# Patient Record
Sex: Female | Born: 1975 | Race: White | Hispanic: No | Marital: Married | Smoking: Never smoker
Health system: Southern US, Community
[De-identification: ages and names within clinical notes are randomized; demographics above are authoritative.]

## PROBLEM LIST (undated history)

## (undated) DIAGNOSIS — R000 Tachycardia, unspecified: Secondary | ICD-10-CM

## (undated) DIAGNOSIS — G43009 Migraine without aura, not intractable, without status migrainosus: Secondary | ICD-10-CM

## (undated) DIAGNOSIS — M25519 Pain in unspecified shoulder: Secondary | ICD-10-CM

## (undated) DIAGNOSIS — R599 Enlarged lymph nodes, unspecified: Secondary | ICD-10-CM

## (undated) DIAGNOSIS — M5412 Radiculopathy, cervical region: Secondary | ICD-10-CM

## (undated) DIAGNOSIS — IMO0002 Reserved for concepts with insufficient information to code with codable children: Secondary | ICD-10-CM

## (undated) DIAGNOSIS — M542 Cervicalgia: Secondary | ICD-10-CM

## (undated) DIAGNOSIS — O99810 Abnormal glucose complicating pregnancy: Secondary | ICD-10-CM

## (undated) DIAGNOSIS — R42 Dizziness and giddiness: Secondary | ICD-10-CM

## (undated) DIAGNOSIS — R7309 Other abnormal glucose: Secondary | ICD-10-CM

## (undated) DIAGNOSIS — F329 Major depressive disorder, single episode, unspecified: Secondary | ICD-10-CM

## (undated) DIAGNOSIS — F3289 Other specified depressive episodes: Secondary | ICD-10-CM

## (undated) HISTORY — PX: ACHILLES TENDON REPAIR: SUR1153

## (undated) HISTORY — DX: Other abnormal glucose: R73.09

## (undated) HISTORY — DX: Radiculopathy, cervical region: M54.12

## (undated) HISTORY — PX: OTHER SURGICAL HISTORY: SHX169

## (undated) HISTORY — DX: Migraine without aura, not intractable, without status migrainosus: G43.009

## (undated) HISTORY — DX: Cervicalgia: M54.2

## (undated) HISTORY — DX: Dizziness and giddiness: R42

## (undated) HISTORY — DX: Morbid (severe) obesity due to excess calories: E66.01

## (undated) HISTORY — DX: Major depressive disorder, single episode, unspecified: F32.9

## (undated) HISTORY — DX: Reserved for concepts with insufficient information to code with codable children: IMO0002

## (undated) HISTORY — PX: CARPAL TUNNEL RELEASE: SHX101

## (undated) HISTORY — DX: Abnormal glucose complicating pregnancy: O99.810

## (undated) HISTORY — DX: Enlarged lymph nodes, unspecified: R59.9

## (undated) HISTORY — DX: Pain in unspecified shoulder: M25.519

## (undated) HISTORY — DX: Tachycardia, unspecified: R00.0

## (undated) HISTORY — DX: Other specified depressive episodes: F32.89

---

## 2002-06-08 HISTORY — PX: OTHER SURGICAL HISTORY: SHX169

## 2002-12-19 ENCOUNTER — Encounter: Payer: Self-pay | Admitting: Family Medicine

## 2005-02-07 ENCOUNTER — Inpatient Hospital Stay (HOSPITAL_COMMUNITY): Admission: AD | Admit: 2005-02-07 | Discharge: 2005-02-07 | Payer: Self-pay | Admitting: Obstetrics & Gynecology

## 2005-06-15 ENCOUNTER — Encounter: Admission: RE | Admit: 2005-06-15 | Discharge: 2005-06-15 | Payer: Self-pay | Admitting: Obstetrics and Gynecology

## 2005-08-06 ENCOUNTER — Inpatient Hospital Stay (HOSPITAL_COMMUNITY): Admission: AD | Admit: 2005-08-06 | Discharge: 2005-08-06 | Payer: Self-pay | Admitting: Obstetrics and Gynecology

## 2005-08-17 ENCOUNTER — Inpatient Hospital Stay (HOSPITAL_COMMUNITY): Admission: AD | Admit: 2005-08-17 | Discharge: 2005-08-20 | Payer: Self-pay | Admitting: Obstetrics and Gynecology

## 2005-08-21 ENCOUNTER — Encounter: Admission: RE | Admit: 2005-08-21 | Discharge: 2005-09-18 | Payer: Self-pay | Admitting: Obstetrics and Gynecology

## 2005-08-23 ENCOUNTER — Observation Stay (HOSPITAL_COMMUNITY): Admission: AD | Admit: 2005-08-23 | Discharge: 2005-08-23 | Payer: Self-pay | Admitting: Obstetrics and Gynecology

## 2006-05-11 ENCOUNTER — Encounter: Payer: Self-pay | Admitting: General Practice

## 2006-06-08 ENCOUNTER — Encounter: Payer: Self-pay | Admitting: General Practice

## 2006-06-17 ENCOUNTER — Ambulatory Visit: Payer: Self-pay | Admitting: Family Medicine

## 2006-06-17 LAB — CONVERTED CEMR LAB
Alkaline Phosphatase: 28 units/L — ABNORMAL LOW (ref 39–117)
CO2: 29 meq/L (ref 19–32)
Calcium: 9.2 mg/dL (ref 8.4–10.5)
Chloride: 101 meq/L (ref 96–112)
Cholesterol: 129 mg/dL (ref 0–200)
GFR calc non Af Amer: 90 mL/min
Glucose, Bld: 108 mg/dL — ABNORMAL HIGH (ref 70–99)
HDL: 34 mg/dL — ABNORMAL LOW (ref >39.0)
Hemoglobin: 12 g/dL (ref 12.0–15.0)
Total Protein: 7.1 g/dL (ref 6.0–8.3)
Triglyceride fasting, serum: 121 mg/dL (ref 0–149)

## 2006-06-18 ENCOUNTER — Encounter: Payer: Self-pay | Admitting: Family Medicine

## 2006-08-05 ENCOUNTER — Ambulatory Visit: Payer: Self-pay | Admitting: Family Medicine

## 2006-08-05 LAB — CONVERTED CEMR LAB
Basophils Relative: 0 % (ref 0.0–1.0)
Eosinophils Relative: 1.6 % (ref 0.0–5.0)
Lymphocytes Relative: 26.3 % (ref 12.0–46.0)
MCV: 85.9 fL (ref 78.0–100.0)
Mono Screen: NEGATIVE
Monocytes Absolute: 0.4 10*3/uL (ref 0.2–0.7)
Monocytes Relative: 6.7 % (ref 3.0–11.0)
Neutrophils Relative %: 65.4 % (ref 43.0–77.0)
WBC: 6.6 10*3/uL (ref 4.5–10.5)

## 2006-08-25 ENCOUNTER — Ambulatory Visit: Payer: Self-pay | Admitting: Internal Medicine

## 2006-12-14 IMAGING — US US OB COMP LESS 14 WK
1 series · 14 of 24 positions shown · non-contrast
Comparison: None.

CLINICAL DATA: Pelvic pain and vaginal bleeding at 11 weeks and 4 days of
pregnancy.

COMPLETE OBSTETRICAL ULTRASOUND LESS THAN 14 WEEKS

[Series 1: us ob comp less 14 wk · 0.29mm/px · 14 of 24 slices shown]
[im 1/24]
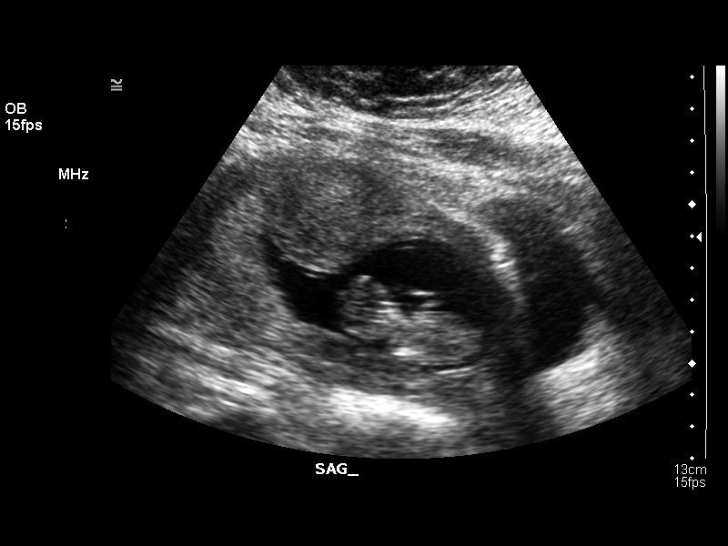
[im 3/24]
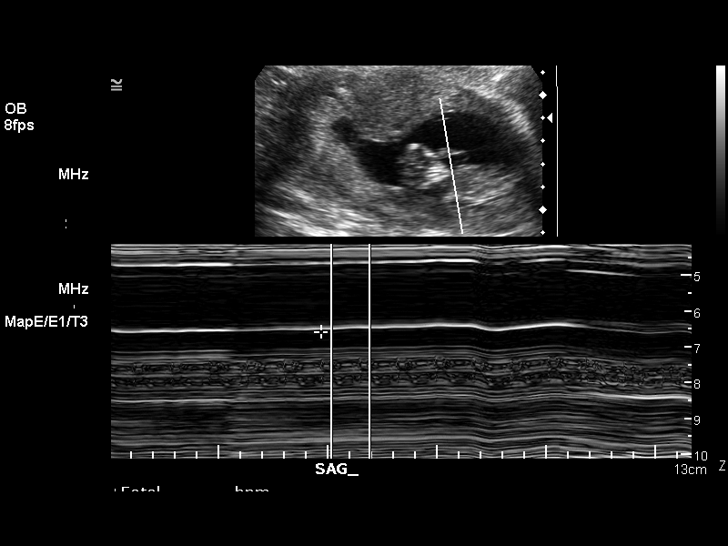
[im 5/24]
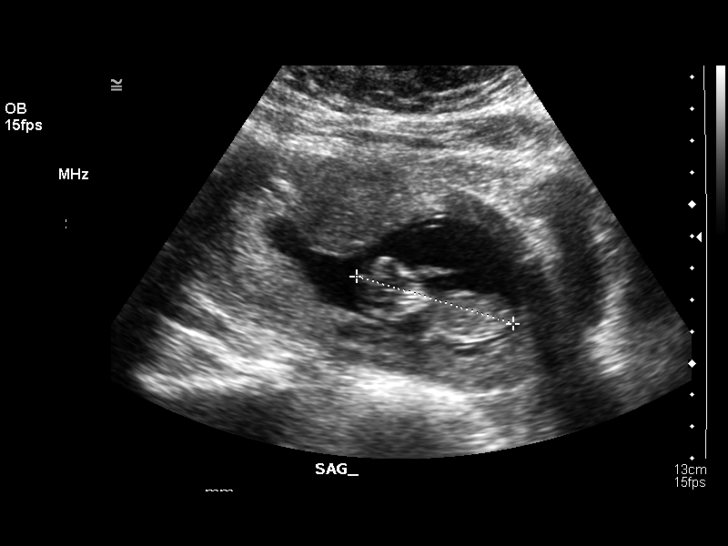
[im 7/24]
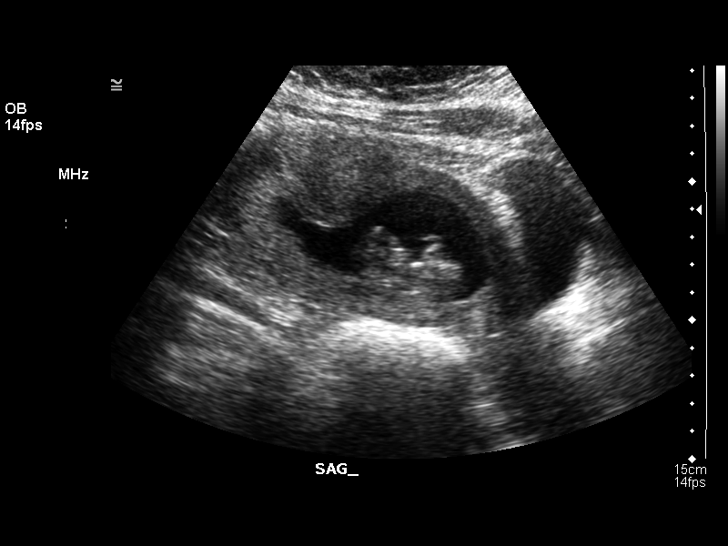
[im 8/24]
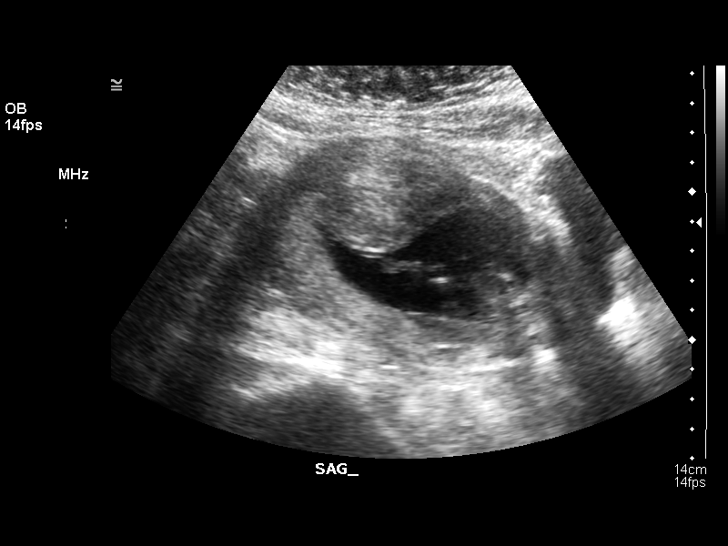
[im 10/24]
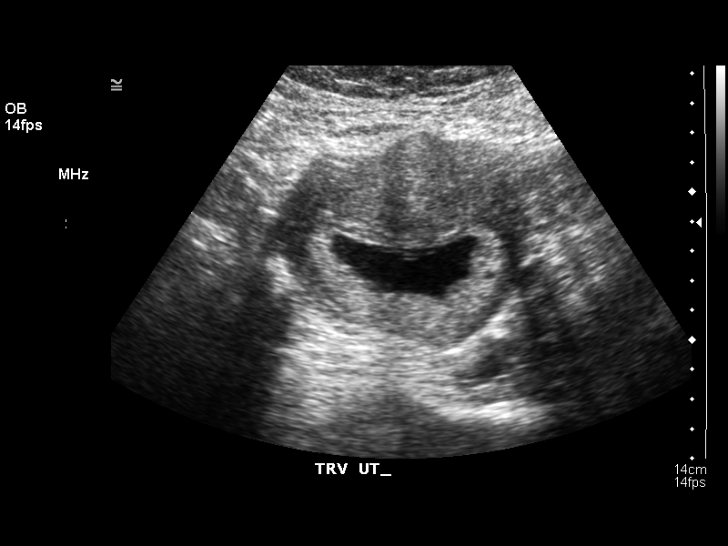
[im 12/24]
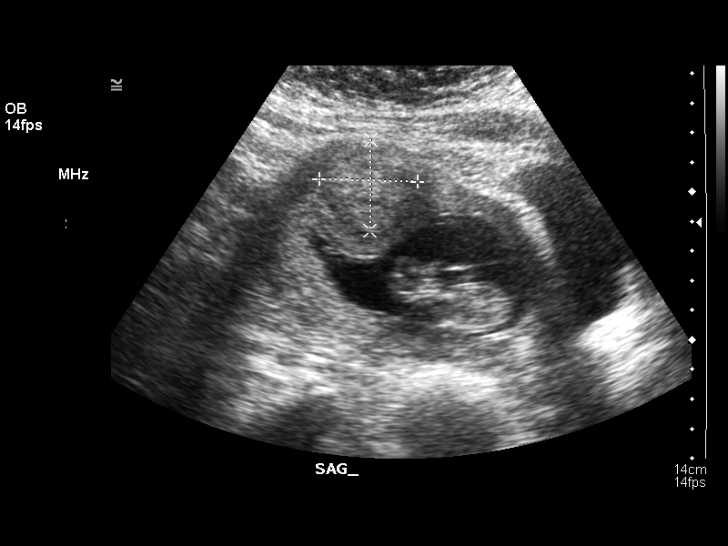
[im 13/24]
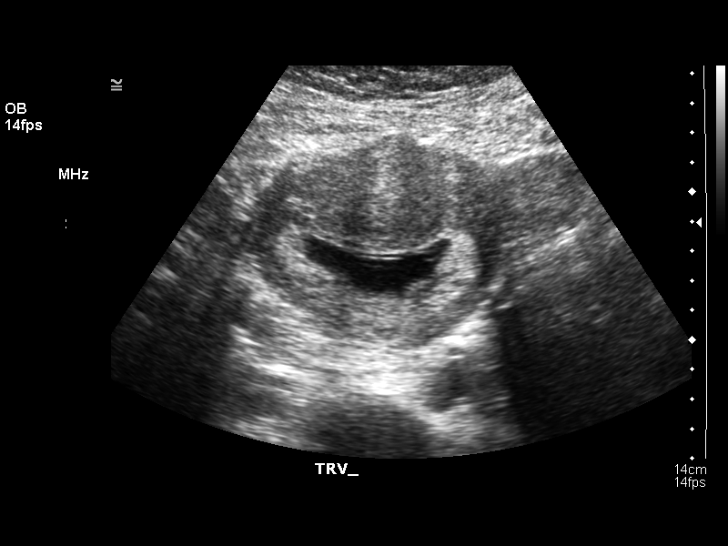
[im 15/24]
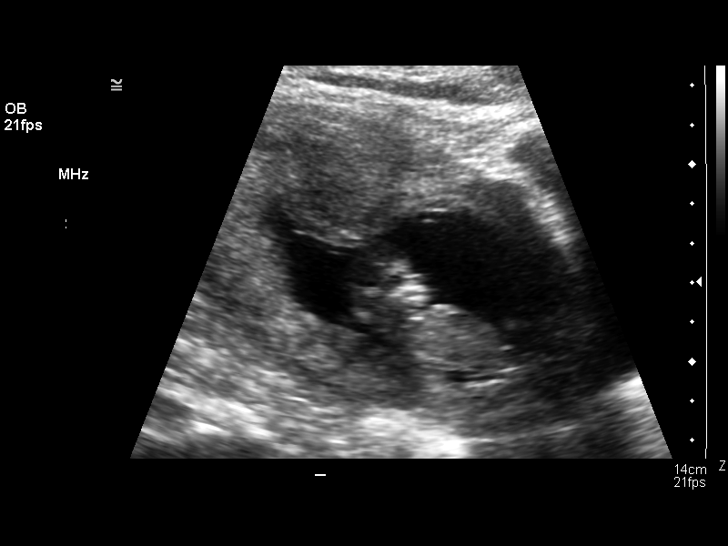
[im 17/24]
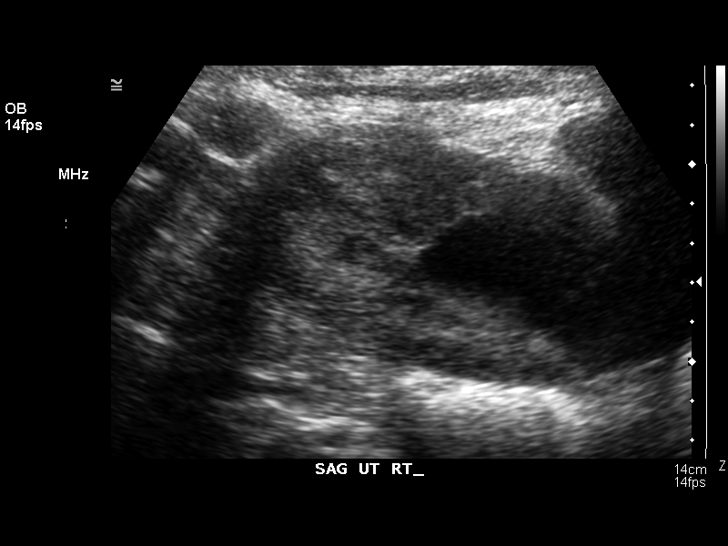
[im 19/24]
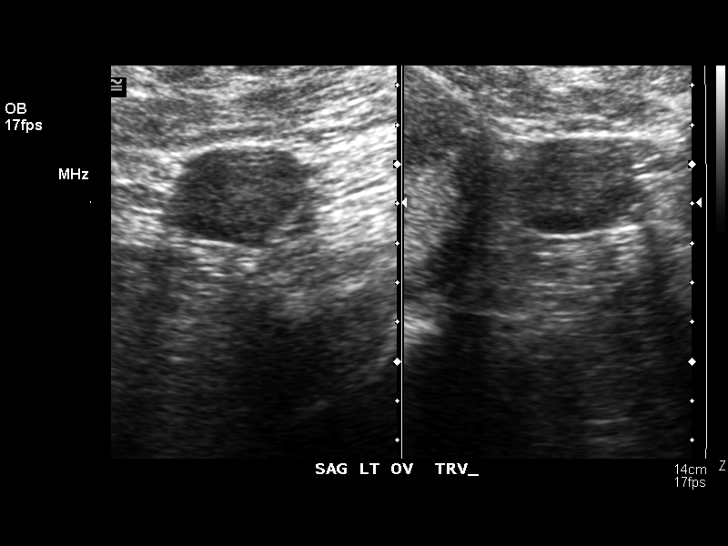
[im 20/24]
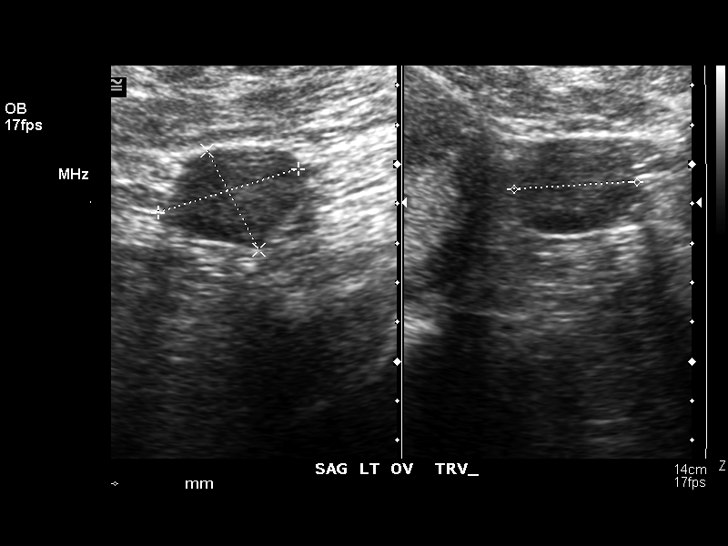
[im 22/24]
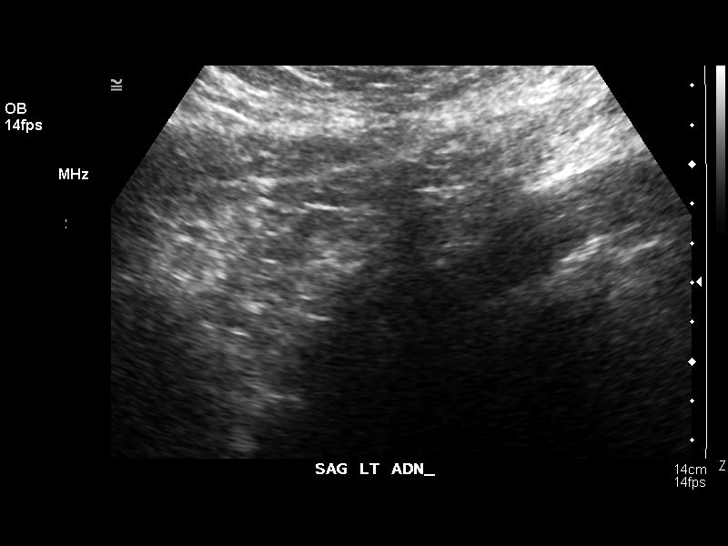
[im 24/24]
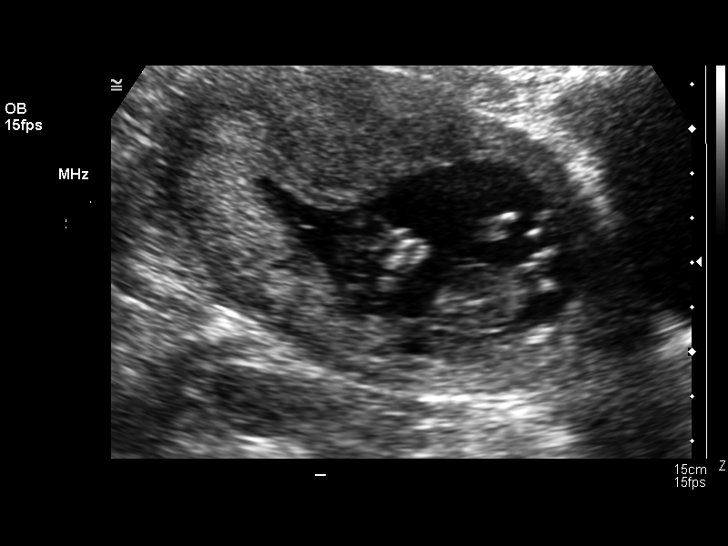

[14 of 24 positions shown; findings below may reference images not displayed]

FINDINGS: Transabdominal sonographic imaging of the gravid uterus demonstrates
a single intrauterine gestation with a fetal crown-rump length of 51.5 mm. This
gives an estimated gestational age of 11 weeks and 6 days. No subchorionic
hemorrhage is seen. The fetus is currently in a breech presentation and a fetal
heart rate of 171 beats per minute was obtained. The placenta is posterior and a
normal amniotic fluid volume is noted. A 3.4 x 3.3 x 3.1 cm rounded masslike
area is demonstrated in the anterior aspect of the uterine fundus, indenting
into the endometrial cavity. Normal appearing left ovary. Nonvisualized right
ovary. No free peritoneal fluid.

IMPRESSION

1. Single intrauterine gestation with an estimated gestational age of 11 weeks
and 6 days.
2. 3.4 cm anterior uterine fibroid or contraction.
3. Nonvisualized maternal right ovary.

## 2007-01-27 ENCOUNTER — Encounter: Payer: Self-pay | Admitting: Family Medicine

## 2007-01-27 DIAGNOSIS — G43009 Migraine without aura, not intractable, without status migrainosus: Secondary | ICD-10-CM | POA: Insufficient documentation

## 2007-01-27 DIAGNOSIS — R7309 Other abnormal glucose: Secondary | ICD-10-CM | POA: Insufficient documentation

## 2007-02-16 ENCOUNTER — Ambulatory Visit: Payer: Self-pay | Admitting: Family Medicine

## 2007-02-17 ENCOUNTER — Encounter (INDEPENDENT_AMBULATORY_CARE_PROVIDER_SITE_OTHER): Payer: Self-pay | Admitting: *Deleted

## 2007-03-03 ENCOUNTER — Ambulatory Visit: Payer: Self-pay | Admitting: Family Medicine

## 2007-03-04 ENCOUNTER — Encounter (INDEPENDENT_AMBULATORY_CARE_PROVIDER_SITE_OTHER): Payer: Self-pay | Admitting: *Deleted

## 2007-03-04 LAB — CONVERTED CEMR LAB
CO2: 30 meq/L (ref 19–32)
Creatinine, Ser: 0.7 mg/dL (ref 0.4–1.2)
Glucose, Bld: 96 mg/dL (ref 70–99)
Potassium: 4.3 meq/L (ref 3.5–5.1)

## 2007-03-18 ENCOUNTER — Ambulatory Visit: Payer: Self-pay | Admitting: Family Medicine

## 2007-03-22 ENCOUNTER — Ambulatory Visit: Payer: Self-pay | Admitting: Family Medicine

## 2007-03-25 ENCOUNTER — Ambulatory Visit: Payer: Self-pay | Admitting: Family Medicine

## 2007-03-25 ENCOUNTER — Telehealth: Payer: Self-pay | Admitting: Family Medicine

## 2007-03-29 LAB — CONVERTED CEMR LAB
Basophils Relative: 1 % (ref 0–1)
CRP: 2.6 mg/dL — ABNORMAL HIGH (ref <0.6)
EBV NA IgG: >4.6 — ABNORMAL HIGH
EBV VCA IgG: 4.62 — ABNORMAL HIGH
EBV VCA IgM: 0.33
Eosinophils Relative: 1 % (ref 0–5)
Hemoglobin: 11.8 g/dL — ABNORMAL LOW (ref 12.0–15.0)
Monocytes Relative: 11 % (ref 3–11)
Platelets: 342 10*3/uL (ref 150–400)
RDW: 12.9 % (ref 11.5–14.0)
Sed Rate: 48 mm/hr — ABNORMAL HIGH (ref 0–22)
WBC: 5.9 10*3/uL (ref 4.0–10.5)

## 2007-06-22 ENCOUNTER — Ambulatory Visit: Payer: Self-pay | Admitting: Family Medicine

## 2007-06-22 DIAGNOSIS — R599 Enlarged lymph nodes, unspecified: Secondary | ICD-10-CM | POA: Insufficient documentation

## 2007-07-28 ENCOUNTER — Telehealth: Payer: Self-pay | Admitting: Family Medicine

## 2007-07-28 ENCOUNTER — Encounter: Payer: Self-pay | Admitting: Family Medicine

## 2007-07-28 DIAGNOSIS — R000 Tachycardia, unspecified: Secondary | ICD-10-CM | POA: Insufficient documentation

## 2007-07-28 DIAGNOSIS — IMO0002 Reserved for concepts with insufficient information to code with codable children: Secondary | ICD-10-CM | POA: Insufficient documentation

## 2007-07-28 DIAGNOSIS — O99810 Abnormal glucose complicating pregnancy: Secondary | ICD-10-CM

## 2007-10-17 ENCOUNTER — Encounter: Payer: Self-pay | Admitting: Family Medicine

## 2008-02-15 ENCOUNTER — Encounter: Payer: Self-pay | Admitting: Family Medicine

## 2008-02-16 ENCOUNTER — Encounter: Payer: Self-pay | Admitting: Family Medicine

## 2008-06-06 ENCOUNTER — Encounter: Payer: Self-pay | Admitting: Family Medicine

## 2008-09-17 ENCOUNTER — Ambulatory Visit: Payer: Self-pay | Admitting: Family Medicine

## 2008-09-17 DIAGNOSIS — J029 Acute pharyngitis, unspecified: Secondary | ICD-10-CM | POA: Insufficient documentation

## 2008-09-17 DIAGNOSIS — M5412 Radiculopathy, cervical region: Secondary | ICD-10-CM | POA: Insufficient documentation

## 2008-09-18 ENCOUNTER — Encounter: Payer: Self-pay | Admitting: Family Medicine

## 2008-10-25 ENCOUNTER — Telehealth: Payer: Self-pay | Admitting: Family Medicine

## 2009-03-12 ENCOUNTER — Telehealth: Payer: Self-pay | Admitting: Family Medicine

## 2009-03-15 ENCOUNTER — Ambulatory Visit: Payer: Self-pay | Admitting: Family Medicine

## 2009-03-28 ENCOUNTER — Encounter: Payer: Self-pay | Admitting: Family Medicine

## 2009-08-27 ENCOUNTER — Ambulatory Visit: Payer: Self-pay | Admitting: Family Medicine

## 2009-08-27 DIAGNOSIS — M542 Cervicalgia: Secondary | ICD-10-CM | POA: Insufficient documentation

## 2009-08-27 DIAGNOSIS — F3289 Other specified depressive episodes: Secondary | ICD-10-CM | POA: Insufficient documentation

## 2009-08-27 DIAGNOSIS — F329 Major depressive disorder, single episode, unspecified: Secondary | ICD-10-CM | POA: Insufficient documentation

## 2009-08-27 DIAGNOSIS — M25519 Pain in unspecified shoulder: Secondary | ICD-10-CM

## 2009-09-24 ENCOUNTER — Ambulatory Visit: Payer: Self-pay | Admitting: Family Medicine

## 2009-09-24 DIAGNOSIS — R42 Dizziness and giddiness: Secondary | ICD-10-CM

## 2009-09-24 LAB — CONVERTED CEMR LAB: Hemoglobin: 12.9 g/dL

## 2009-09-25 LAB — CONVERTED CEMR LAB
CO2: 30 meq/L (ref 19–32)
Chloride: 102 meq/L (ref 96–112)
Creatinine, Ser: 0.7 mg/dL (ref 0.4–1.2)
GFR calc non Af Amer: 102.17 mL/min (ref >60)

## 2009-12-20 ENCOUNTER — Encounter: Payer: Self-pay | Admitting: Family Medicine

## 2010-04-04 ENCOUNTER — Encounter: Payer: Self-pay | Admitting: Family Medicine

## 2010-07-08 NOTE — Assessment & Plan Note (Signed)
Summary: f/u dlo   Vital Signs:  Patient profile:   35 year old female Height:      65.5 inches Weight:      199.8 pounds BMI:     32.86 Temp:     98.2 degrees F oral Pulse rate:   96 / minute Pulse rhythm:   regular BP sitting:   122 / 78  (left arm) Cuff size:   large  Vitals Entered By: Benny Lennert CMA Duncan Dull) (September 24, 2009 11:11 AM)  History of Present Illness: Chief complaint follow up depression is a little bit better  Depression, miminal improvement on sertraline 50 mg daily. Has had some improvement with insomnia.  No SE to the medicaiton.   Flexeril is helping shoulder a lot. Using every night.  Plans on seeing [facility] next week.   Dizzyness, intermittantly x 1 week. S/P lap band. Had GI bug last week..diarrhea and one vomiting episode...resolved now.  Decreased by mouth intake sincce lap band, only eating 5 bites at at time..  Trying to drink water.  no menses currently, but last menses fairly heavy.   Problems Prior to Update: 1)  Dizziness  (ICD-780.4) 2)  Depression, Mild  (ICD-311) 3)  Shoulder Pain, Left  (ICD-719.41) 4)  Neck Pain, Left  (ICD-723.1) 5)  Sore Throat  (ICD-462) 6)  Cervical Radiculopathy, Left  (ICD-723.4) 7)  Cervical Lymphadenopathy, Anterior, Left  (ICD-785.6) 8)  Prediabetes  (ICD-790.29) 9)  Obesity, Morbid  (ICD-278.01) 10)  Hx of Gestational Diabetes  (ICD-648.80) 11)  Hx of Pre-eclampsia  (ICD-642.40) 12)  Hx of Tachycardia  ~100 Bpm  (ICD-785.0) 13)  Common Migraine  (ICD-346.10)  Current Medications (verified): 1)  Excedrin Migraine 250-250-65 Mg  Tabs (Aspirin-Acetaminophen-Caffeine) .... As Needed 2)  Multivitamins   Tabs (Multiple Vitamin) .... One A Day 3)  Omeprazole 10 Mg Cpdr (Omeprazole) .... Take 1 Tablet By Mouth Once A Day 4)  Sertraline Hcl 100 Mg Tabs (Sertraline Hcl) .... Take 1 Tablet By Mouth Once A Day 5)  Cyclobenzaprine Hcl 10 Mg Tabs (Cyclobenzaprine Hcl) .... 1/2 To 1 Tab By Mouth Daily As  Needed Muscle Spasm 6)  Meloxicam 15 Mg Tabs (Meloxicam) .... Onmce Daily  Allergies: 1)  ! Septra Ds (Sulfamethoxazole-Trimethoprim) 2)  ! Levaquin (Levofloxacin)  Past History:  Past medical, surgical, family and social histories (including risk factors) reviewed, and no changes noted (except as noted below).  Past Medical History: Reviewed history from 03/18/2007 and no changes required. Current Problems:  PREDIABETES (ICD-790.29) OBESITY, MORBID (ICD-278.01) GESTATIONAL DIABETES (ICD-648.80) PRE-ECLAMPSIA (ICD-642.40) TACHYCARDIA  ~100 BPM (ICD-785.0) COMMON MIGRAINE (ICD-346.10)    Past Surgical History: Reviewed history from 01/27/2007 and no changes required. 2004    Right arthoscopy 2004    Carpal tunnel release             NSVD  Family History: Reviewed history from 01/27/2007 and no changes required. Father: Alive 65 BPH, no history of other family Mother: Alive 73 CAD, high chol, HTN, depression Siblings: 1 brother HTN MGF - MI < 36 Breast CA:  MGM - age 15's  Social History: Reviewed history from 01/27/2007 and no changes required. Never Smoked Alcohol use-yes, rare, 1 drink/month Drug use-no Regular exercise-no, working on starting treadmill Marital Status: Married Children: 10 months Occupation: [employer] manager Diet:  (+) caffeine, soft drinks, carbs, fruits/veggies  Review of Systems General:  Complains of fatigue; denies fever. CV:  Denies chest pain or discomfort. Resp:  Denies shortness of breath.  GI:  Denies abdominal pain. GU:  Denies dysuria.  Physical Exam  General:  overwieght appearing female In NAd Mouth:  Oral mucosa and oropharynx without lesions or exudates.  Teeth in good repair. Neck:  no cervical or supraclavicular lymphadenopathy no carotid bruit or thyromegaly  Lungs:  Normal respiratory effort, chest expands symmetrically. Lungs are clear to auscultation, no crackles or wheezes. Heart:  Normal rate and regular rhythm. S1  and S2 normal without gallop, murmur, click, rub or other extra sounds. Pulses:  R and L posterior tibial pulses are full and equal bilaterally  Extremities:  no edema Neurologic:  No cranial nerve deficits noted. Station and gait are normal.  Sensory, motor and coordinative functions appear intact. Psych:  Oriented X3, memory intact for recent and remote, normally interactive, good eye contact, but tearful.     Impression & Recommendations:  Problem # 1:  DEPRESSION, MILD (ICD-311) increase sertraline to max dose. Follow up in 1 month. Denies SI. Consider counseling..refuses at this time.  Her updated medication list for this problem includes:    Sertraline Hcl 100 Mg Tabs (Sertraline hcl) .Marland Kitchen... Take 1 tablet by mouth once a day  Problem # 2:  DIZZINESS (ICD-780.4) Nml neuro exam. Eval with labs. push fluids.  Orders: Hgb (85018) TLB-BMP (Basic Metabolic Panel-BMET) (80048-METABOL)  Problem # 3:  SHOULDER PAIN, LEFT (ICD-719.41) improved with flexeril..pending appt with Ortho for further recs.  Her updated medication list for this problem includes:    Excedrin Migraine 250-250-65 Mg Tabs (Aspirin-acetaminophen-caffeine) .Marland Kitchen... As needed    Cyclobenzaprine Hcl 10 Mg Tabs (Cyclobenzaprine hcl) .Marland Kitchen... 1/2 to 1 tab by mouth daily as needed muscle spasm    Meloxicam 15 Mg Tabs (Meloxicam) ..... Onmce daily  Complete Medication List: 1)  Excedrin Migraine 250-250-65 Mg Tabs (Aspirin-acetaminophen-caffeine) .... As needed 2)  Multivitamins Tabs (Multiple vitamin) .... One a day 3)  Omeprazole 10 Mg Cpdr (Omeprazole) .... Take 1 tablet by mouth once a day 4)  Sertraline Hcl 100 Mg Tabs (Sertraline hcl) .... Take 1 tablet by mouth once a day 5)  Cyclobenzaprine Hcl 10 Mg Tabs (Cyclobenzaprine hcl) .... 1/2 to 1 tab by mouth daily as needed muscle spasm 6)  Meloxicam 15 Mg Tabs (Meloxicam) .... Onmce daily  Patient Instructions: 1)  Call in 1 month if depresson not improved, or earlier if  SE.  2)  Push fluid intake.  Prescriptions: OMEPRAZOLE 10 MG CPDR (OMEPRAZOLE) Take 1 tablet by mouth once a day  #90 x 3   Entered and Authorized by:   Kerby Nora MD   Signed by:   Kerby Nora MD on 09/24/2009   Method used:   Print then Give to Patient   RxID:   [id number] SERTRALINE HCL 100 MG TABS (SERTRALINE HCL) Take 1 tablet by mouth once a day  #30 x 11   Entered and Authorized by:   Kerby Nora MD   Signed by:   Kerby Nora MD on 09/24/2009   Method used:   Electronically to        [facility]  [address] (retail)       [address]       [place], [place]  [address]       Ph: [phone] or [phone]       Fax: [phone]   RxID:   [id number]   Current Allergies (reviewed today): ! SEPTRA DS (SULFAMETHOXAZOLE-TRIMETHOPRIM) ! LEVAQUIN (LEVOFLOXACIN)  Laboratory Results   Blood Tests   Date/Time Received: September 24, 2009 11:50 AM Date/Time Reported: September 24, 2009 11:50 AM    CBC   HGB:  12.9 g/dL   (Normal Range: 54.0-98.1 in Males, 12.0-15.0 in Females)

## 2010-07-08 NOTE — Letter (Signed)
Summary: [facility]  [facility]   Imported By: Maryln Gottron 04/24/2010 12:27:05  _____________________________________________________________________  External Attachment:    Type:   Image     Comment:   External Document

## 2010-07-08 NOTE — Assessment & Plan Note (Signed)
Summary: 30 MIN F/U/CLE   Vital Signs:  Patient profile:   35 year old female Height:      65.5 inches Weight:      209.6 pounds BMI:     34.47 Temp:     98.2 degrees F oral Pulse rate:   64 / minute Pulse rhythm:   regular BP sitting:   120 / 80  (left arm) Cuff size:   large  Vitals Entered By: Benny Lennert CMA Duncan Dull) (August 27, 2009 12:07 PM)  History of Present Illness: Chief complaint left shoulder pain depression for 4-5 months/ mri of neck at [facility] hospital  Contiued left shoulder pain, worse at end of the day. Pain is progressively worse..now more in shoulder blade to top of deltoid. Occ numbness in left arm and fingertips  After driving in car very severe. Motrin helps but not supposed to take this because s/p lap band surgery. Tramadol didn't help any. Feels weaker in left arm, occ dropping thing. Not worse with particular movement of head or left shoulder. not keeping her up at night.  Seening PT..no improvemnt. Seeing chiropractor.  No right neck/arm symptoms.  Had MRI cervical spine in 03/2010 Very tiny C6-C7 disc protrusion...otherwise normal Cspine.  Protrusion shows no evidence of nerve compression.  Depression in last 4-6 months. Decreased energy. More tearful lately. Some insomnia. No SI, no HI. Stressful job. No SI. Interested in [employer].   Problems Prior to Update: 1)  Sore Throat  (ICD-462) 2)  Cervical Radiculopathy, Left  (ICD-723.4) 3)  Cervical Lymphadenopathy, Anterior, Left  (ICD-785.6) 4)  Prediabetes  (ICD-790.29) 5)  Obesity, Morbid  (ICD-278.01) 6)  Hx of Gestational Diabetes  (ICD-648.80) 7)  Hx of Pre-eclampsia  (ICD-642.40) 8)  Hx of Tachycardia  ~100 Bpm  (ICD-785.0) 9)  Common Migraine  (ICD-346.10)  Current Medications (verified): 1)  Excedrin Migraine 250-250-65 Mg  Tabs (Aspirin-Acetaminophen-Caffeine) .... As Needed 2)  Multivitamins   Tabs (Multiple Vitamin) .... One A Day 3)  Prilosec 10 Mg Cpdr (Omeprazole) ....  Daily 4)  Sertraline Hcl 50 Mg Tabs (Sertraline Hcl) .Marland Kitchen.. 1 Tab Po  Daily 5)  Cyclobenzaprine Hcl 10 Mg Tabs (Cyclobenzaprine Hcl) .... 1/2 To 1 Tab By Mouth Daily As Needed Muscle Spasm  Allergies: 1)  ! Septra Ds (Sulfamethoxazole-Trimethoprim) 2)  ! Levaquin (Levofloxacin)  Past History:  Past medical, surgical, family and social histories (including risk factors) reviewed, and no changes noted (except as noted below).  Past Medical History: Reviewed history from 03/18/2007 and no changes required. Current Problems:  PREDIABETES (ICD-790.29) OBESITY, MORBID (ICD-278.01) GESTATIONAL DIABETES (ICD-648.80) PRE-ECLAMPSIA (ICD-642.40) TACHYCARDIA  ~100 BPM (ICD-785.0) COMMON MIGRAINE (ICD-346.10)    Past Surgical History: Reviewed history from 01/27/2007 and no changes required. 2004    Right arthoscopy 2004    Carpal tunnel release             NSVD  Family History: Reviewed history from 01/27/2007 and no changes required. Father: Alive 23 BPH, no history of other family Mother: Alive 65 CAD, high chol, HTN, depression Siblings: 1 brother HTN MGF - MI < 101 Breast CA:  MGM - age 39's  Social History: Reviewed history from 01/27/2007 and no changes required. Never Smoked Alcohol use-yes, rare, 1 drink/month Drug use-no Regular exercise-no, working on starting treadmill Marital Status: Married Children: 10 months Occupation: [employer] manager Diet:  (+) caffeine, soft drinks, carbs, fruits/veggies  Review of Systems General:  Complains of fatigue; denies fever. CV:  Denies chest pain or  discomfort. Resp:  Denies shortness of breath. GI:  Denies abdominal pain, constipation, and diarrhea. GU:  Denies dysuria. Neuro:  Denies headaches.  Physical Exam  General:  overwieght appearing female In NAd Mouth:  Oral mucosa and oropharynx without lesions or exudates.  Teeth in good repair. Neck:  no cervical or supraclavicular lymphadenopathy no carotid bruit or  thyromegaly  Lungs:  Normal respiratory effort, chest expands symmetrically. Lungs are clear to auscultation, no crackles or wheezes. Heart:  Normal rate and regular rhythm. S1 and S2 normal without gallop, murmur, click, rub or other extra sounds. Abdomen:  Bowel sounds positive,abdomen soft and non-tender without masses, organomegaly or hernias noted. Msk:  ttp in left trapezius diffusely no cervical vertebral pain mildly positive Spurling's on right. decrease ROM neck flexion, chin to shoulder on right Neg Phalen/tinel's, neg compression at ulnar nerve at elbow Mild right ant subacromial pain. Mild ttp with right internal rotation of shouler Pulses:  2plus radial pulse B Neurologic:  No cranial nerve deficits noted. Station and gait are normal.  Sensory, motor and coordinative functions appear intact. Psych:  Oriented X3, memory intact for recent and remote, normally interactive, good eye contact, but tearful.     Impression & Recommendations:  Problem # 1:  NECK PAIN, LEFT (ICD-723.1) ? due to tension..cervical MRI.Marland Kitchenonly mildly positive. Will treat with muscle relaxant. Refer to [facility] for further eval. Continue PT, heat. Consider Yoga.  The following medications were removed from the medication list:    Tramadol Hcl 50 Mg Tabs (Tramadol hcl) .Marland Kitchen... 1 tab by mouth in evening as needed severe pain. Her updated medication list for this problem includes:    Excedrin Migraine 250-250-65 Mg Tabs (Aspirin-acetaminophen-caffeine) .Marland Kitchen... As needed    Cyclobenzaprine Hcl 10 Mg Tabs (Cyclobenzaprine hcl) .Marland Kitchen... 1/2 to 1 tab by mouth daily as needed muscle spasm  Orders: Radiology Referral (Radiology)  Problem # 2:  SHOULDER PAIN, LEFT (ICD-719.41) Not clearly positive for bursitis, rotator cuff. Continue PT.  The following medications were removed from the medication list:    Tramadol Hcl 50 Mg Tabs (Tramadol hcl) .Marland Kitchen... 1 tab by mouth in evening as needed severe pain. Her updated medication  list for this problem includes:    Excedrin Migraine 250-250-65 Mg Tabs (Aspirin-acetaminophen-caffeine) .Marland Kitchen... As needed    Cyclobenzaprine Hcl 10 Mg Tabs (Cyclobenzaprine hcl) .Marland Kitchen... 1/2 to 1 tab by mouth daily as needed muscle spasm  Orders: Radiology Referral (Radiology)  Problem # 3:  DEPRESSION, MILD (ICD-311)  Her updated medication list for this problem includes:    Sertraline Hcl 50 Mg Tabs (Sertraline hcl) .Marland Kitchen... 1 tab po  daily  Discussed treatment options, including trial of antidpressant medication. Start sertraline.Marland Kitchenexpect 3-4 weeks before significant imrpovement. Discussed SE of med.  Patient agrees to call if any worsening of symptoms or thoughts of doing harm arise. Verified that the patient has no suicidal ideation at this time.   Complete Medication List: 1)  Excedrin Migraine 250-250-65 Mg Tabs (Aspirin-acetaminophen-caffeine) .... As needed 2)  Multivitamins Tabs (Multiple vitamin) .... One a day 3)  Prilosec 10 Mg Cpdr (Omeprazole) .... Daily 4)  Sertraline Hcl 50 Mg Tabs (Sertraline hcl) .Marland Kitchen.. 1 tab po  daily 5)  Cyclobenzaprine Hcl 10 Mg Tabs (Cyclobenzaprine hcl) .... 1/2 to 1 tab by mouth daily as needed muscle spasm  Patient Instructions: 1)  Referral Appointment Information 2)  Day/Date: 3)  Time: 4)  Place/MD: 5)  Address: 6)  Phone/Fax: 7)  Patient given appointment information. Information/Orders faxed/mailed.  8)  Muscle relaxant at end of day for neck muscle spasm. 9)  Start sertraline. Stress reduction and relaxation.  10)  Please schedule a follow-up appointment in 1 month mood.  Prescriptions: CYCLOBENZAPRINE HCL 10 MG TABS (CYCLOBENZAPRINE HCL) 1/2 to 1 tab by mouth daily as needed muscle spasm  #30 x 0   Entered and Authorized by:   Kerby Nora MD   Signed by:   Kerby Nora MD on 08/27/2009   Method used:   Print then Give to Patient   RxID:   [id number] CYCLOBENZAPRINE HCL 5 MG TABS (CYCLOBENZAPRINE HCL) 1/2 to 1 tab by mouth daily as  needed pain  #30 x 0   Entered and Authorized by:   Kerby Nora MD   Signed by:   Kerby Nora MD on 08/27/2009   Method used:   Print then Give to Patient   RxID:   [id number] SERTRALINE HCL 50 MG TABS (SERTRALINE HCL) 1 tab po  daily  #30 x 3   Entered and Authorized by:   Kerby Nora MD   Signed by:   Kerby Nora MD on 08/27/2009   Method used:   Print then Give to Patient   RxID:   [id number]   Current Allergies (reviewed today): ! SEPTRA DS (SULFAMETHOXAZOLE-TRIMETHOPRIM) ! LEVAQUIN (LEVOFLOXACIN)

## 2010-07-08 NOTE — Consult Note (Signed)
Summary: [facility]  [facility]   Imported By: Maryln Gottron 01/03/2010 10:34:47  _____________________________________________________________________  External Attachment:    Type:   Image     Comment:   External Document

## 2010-09-08 ENCOUNTER — Encounter: Payer: Self-pay | Admitting: Family Medicine

## 2010-09-09 ENCOUNTER — Ambulatory Visit (INDEPENDENT_AMBULATORY_CARE_PROVIDER_SITE_OTHER): Payer: BC Managed Care – PPO | Admitting: Family Medicine

## 2010-09-09 ENCOUNTER — Encounter: Payer: Self-pay | Admitting: *Deleted

## 2010-09-09 ENCOUNTER — Encounter: Payer: Self-pay | Admitting: Family Medicine

## 2010-09-09 VITALS — BP 116/74 | HR 74 | Temp 98.6°F | Ht 65.0 in | Wt 217.1 lb

## 2010-09-09 DIAGNOSIS — M2550 Pain in unspecified joint: Secondary | ICD-10-CM | POA: Insufficient documentation

## 2010-09-09 DIAGNOSIS — R229 Localized swelling, mass and lump, unspecified: Secondary | ICD-10-CM

## 2010-09-09 DIAGNOSIS — R609 Edema, unspecified: Secondary | ICD-10-CM | POA: Insufficient documentation

## 2010-09-09 LAB — POCT URINALYSIS DIPSTICK
Glucose, UA: NEGATIVE
Leukocytes, UA: NEGATIVE
Nitrite, UA: NEGATIVE
Urobilinogen, UA: 0.2

## 2010-09-09 NOTE — Progress Notes (Addendum)
Subjective:    Patient ID: [name], female    DOB: 1975/12/24, 35 y.o.   MRN: [id number]  HPI CC: joint pains?  Hasn't felt like self for last 2 months.  Last Thursday had sharp neck pain (which is not out of norm for her).  Friday started feeling tired, joints heavy.  Started having joint pains, started with elbows then wrist, knees and ankles.  Feet and hands feeling very tight and swollen.  Feels very uncomfortable and having trouble sleeping.  No shoulder pain.  Tmax 100.5.  Went to [facility] at [place], told to go to ER.  Went to ER, checked blood work and told they thought she had autoimmune condition, to f/u with rheum but first available appt is May 3rd at [facility].  Would like to see someone sooner.  Per pt UA showed small amt WBC and small amt RBC.    Records reviewed - mild anemia which is not new, ESR  H/o cervical pain in past, told stress related.    No redness or warmth of joints.  + small rash on forearms when at ER but that went away, no other rash. No oral lesions.  No vision changes or redness around the eyes.  No abd pain, chest pain, SOB, dizziness, HA.  Occasional nausea (not out of the norm.)  No recent congestion, RN, cough, viral illness.  Denies recent injury to ankle.  Denies heat/cold intolerance, weight changes, skin or hair changes otherwise.    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  No h/o autoimmune conditions in past.  No fmhx autoimmune conditions in past.  LMP march 10th.  No chance could be pregnant.  Tried motrin 600-800 mg prn (sparingly because of lap band).  Percocet from ER didn't really help.  Motrin helps more.    Review of Systems Per HPI    Objective:   Physical Exam  Constitutional: She appears well-developed and well-nourished. No distress.  HENT:  Head: Normocephalic and atraumatic.  Right Ear: External ear normal.  Left Ear: External ear normal.  Nose: Nose normal.  Mouth/Throat: Oropharynx is  clear and moist. No oropharyngeal exudate.  Eyes: Conjunctivae and EOM are normal. Pupils are equal, round, and reactive to light. No scleral icterus.  Neck: Normal range of motion. Neck supple. Carotid bruit is not present. Thyromegaly (mild) present.  Cardiovascular: Normal rate, regular rhythm, normal heart sounds and intact distal pulses.   No murmur heard. Pulmonary/Chest: Effort normal and breath sounds normal. No respiratory distress. She has no wheezes. She has no rales.  Musculoskeletal:       Shoulders FROM, no pain/deformity, effusion or swelling. Elbows bilaterally FROM, nontender, no swelling or erythema/effusion. Wrists bliaterally swollen, not erythematous or warm.  + tender esp to movement against resistance. DIP/PIP/MCPs + swelling and tender, nl ROM, no erythema/calor. Knees with mild swelling and tenderness with testing ROM, no erythema or calor. Ankles with limited ROM L>R 2/2 pain, no erythema or calor.  BLE with 1+ pitting edema.  BUE with mild nonpitting edema fingers and hands/wrists.  Lymphadenopathy:    She has no cervical adenopathy.  Neurological: She is alert. She displays normal reflexes. No cranial nerve deficit. Coordination normal.  Skin: Skin is dry. No rash noted. No erythema.       + dry, rough skin dorsal hands over MCPs bilaterally, new per patient.  Psychiatric: She has a normal mood and affect.  Assessment & Plan:

## 2010-09-09 NOTE — Patient Instructions (Addendum)
Blood work today to trend measure of inflammation.  Also to check on thyroid function. Depending on results we may want further blood work. Urine looking ok today. We will start setting up a referral to rheumatologist for further evaluation - pass by [name]'s office on your way out.

## 2010-09-09 NOTE — Assessment & Plan Note (Signed)
Mild pitting BLE.  Nonpitting BUE.  Per pt report, UA without protein at ER.  Recheck today - trace protein.  Obtain U pr/cr ratio.  If elevated, proceed with 24 hour urine collection. See below.

## 2010-09-09 NOTE — Assessment & Plan Note (Signed)
Significant pain that led her to seek care at [facility] ER - found to have anemia with Hgb to 10.9, low albumin to 3.1, and elevated infl markers (CRP 2.6, ESR mildly elevated to 43.  Recheck infl markers today to trend.  Check TSH today.  Concern for systemic process/autoimmune. Per pt report, feels tightness in skin - ? scleroderma, consider checking anti Scl70. Doesn't sound consistent with RA, SLE, other autoimmune condition.  No family hx of same.  If initial work up unrevealing, check ANA and RF as well as  Pt doesn't feel could function at work, provided with work excuse until Monday. Recommend schedule ibuprofen for next few days.  If not resolving, consider steroid course. Set up with rheumatology referral.

## 2010-09-10 LAB — SEDIMENTATION RATE: Sed Rate: 34 mm/hr — ABNORMAL HIGH (ref 0–22)

## 2010-09-10 LAB — PROTEIN / CREATININE RATIO, URINE: Protein Creatinine Ratio: 0.03 (ref <0.15)

## 2010-10-03 ENCOUNTER — Ambulatory Visit: Payer: Self-pay | Admitting: Family Medicine

## 2010-10-03 DIAGNOSIS — Z0289 Encounter for other administrative examinations: Secondary | ICD-10-CM

## 2010-10-24 NOTE — Op Note (Signed)
[name], [name]            ACCOUNT NO.:  [id number]   MEDICAL RECORD NO.:  [id number]          PATIENT TYPE:  INP   LOCATION:  9123                          FACILITY:  [facility]   PHYSICIAN:  Richardean Sale, M.D.   DATE OF BIRTH:  04/11/1976   DATE OF PROCEDURE:  08/18/2005  DATE OF DISCHARGE:                                 OPERATIVE REPORT   PREOPERATIVE DIAGNOSIS:  1.  39-week intrauterine pregnancy in active labor with prolonged second      stage.  2.  Vaginal vault laceration.   POSTOP DIAGNOSES:  1.  39-week intrauterine pregnancy in active labor with prolonged second      stage.  2.  Vaginal vault laceration.   PROCEDURE:  1.  Low vacuum-assisted vaginal delivery.  2.  Repair of vaginal vault laceration.   SURGEON:  Richardean Sale, M.D.   ANESTHESIA:  Epidural.   COMPLICATIONS:  None.   ESTIMATED BLOOD LOSS:  600 mL.   FINDINGS:  Viable female infant, cephalic presentation, right occiput  anterior, intact placenta with three-vessel cord delivered manually,  spontaneous second-degree perineal laceration and vaginal vault laceration  Apgars 8 and 9.   SPECIMENS:  None.   INDICATIONS:  This is a 35 year old gravida 1, para 0 white female who is at  [redacted] weeks gestation who made adequate progress in labor and after reaching  complete dilation, pushed with good maternal expulsive effort for 3 hours  with epidural anesthesia. Given the prolonged second stage the patient was  counseled on indication for an operative vaginal delivery. The risks,  benefits, alternatives of operative vaginal delivery reviewed with the  patient in detail. We discussed risks which include but not limited to  hemorrhage requiring transfusion, infection, injury to the fetal scalp such  as fetal scalp hematoma and rare but severe complications, possibility of a  subgaleal hematoma. The patient voiced understanding of above risks, desired  to proceed. Informed consent was obtained.   Fetal  heart rate tracing remained reassuring throughout the patient's labor  and delivery. Upon examination cervix was completely dilated, completely  effaced with the leading point the fetal skull at +2 station. Position was  right occiput anterior. Red rubber catheter was used to drain the bladder.  The patient's epidural was adequate. During the second stage the patient had  a spontaneous vaginal tear with a moderate amount of bleeding which had  tamponaded during the latter part of the second stage. After position was  confirmed the Mity-Vac was then applied to the flexion point of the fetal  vertex and with a single contraction and one application of the vacuum, with  suction in the green safety zone, the fetal vertex was then delivered. The  vacuum was then discontinued and the infant was then delivered with a  vigorous cry. Shoulders were delivered without difficulty within 10 seconds  of delivery of the head. The cord was clamped and cut and the infant was  handed to the labor and delivery nurse. Apgars were 8 and 9. Arterial cord  pH was obtained but was unable to be run due to a clotted specimen.  Cord  blood was obtained. The placenta did not deliver spontaneously after waiting  for 30 minutes. Therefore the placenta was manually removed and the uterus  was and explored. There was no evidence of retained placenta or tissue.  There was mild atony encountered that was controlled with intravenous  Pitocin and bimanual uterine massage. At this point there was brisk bleeding  noted to be coming from the patient's vagina. Inspection revealed  spontaneous second-degree perineal laceration and a spontaneous midline  vaginal vault laceration. The second-degree perineal laceration was repaired  in standard fashion with Vicryl suture and was hemostatic. The vaginal vault  laceration was difficult to repair secondary to the patient's habitus and  poor visualization. Therefore I made the  recommendation to proceed with  repair in the operating room for better visualization and exposure. Prior to  taking the patient to the operating, the risks and benefits were reviewed  and informed consent was obtained.   DESCRIPTION OF PROCEDURE:  The patient was then taken to the operating room  where she was placed in dorsal lithotomy position. Her epidural was tested  and was adequate. She was prepped with Betadine in the usual sterile  fashion. Exploration the vagina revealed that the perineal laceration repair  was intact. There was still brisk bleeding coming from the apex of the  vaginal vault tear. This was made hemostatic by placing multiple figure-of-  eight Vicryl sutures. In addition there was brisk bleeding coming from the  left side the hymeneal ring. This was also made hemostatic with figure-of-  eight Vicryl sutures. The patient's tissue was noted to be quite poor and  very friable but hemostasis was ultimately achieved and once hemostasis was  confirmed, the patient was taken out of dorsolithotomy position and was then  taken to recovery room awake and in stable condition. There were no  complications and all sponge, lap, needle and instrument counts were correct  x2. The patient and mother are doing well at the time of this dictation.      Richardean Sale, M.D.  Electronically Signed     JW/MEDQ  D:  08/18/2005  T:  08/19/2005  Job:  [id number]

## 2010-10-24 NOTE — Assessment & Plan Note (Signed)
[facility]                                 ON-CALL NOTE   CALEIGH, RABELO                   MRN:          [id number]  DATE:03/26/2007                            DOB:          12/31/1975    PHONE:  [phone].   PRIMARY CARE PHYSICIAN:  Kerby Nora, M.D.   The patient was diagnosed with strep throat on Tuesday of last week.  She had been on medicine.  She is asymptomatic.  However, she now has  left her medicine in [place] and will not get it until Monday.  Since  she is asymptomatic otherwise well, advised to just wait till Monday to  restart her medication.     Jeffrey A. Tawanna Cooler, MD  Electronically Signed    JAT/MedQ  DD: 03/26/2007  DT: 03/27/2007  Job #: [id number]

## 2010-10-24 NOTE — H&P (Signed)
NAMEFIDENCIA, [name]            ACCOUNT NO.:  [id number]   MEDICAL RECORD NO.:  [id number]          PATIENT TYPE:  INP   LOCATION:  9320                          FACILITY:  [facility]   PHYSICIAN:  Richardean Sale, M.D.   DATE OF BIRTH:  1975/11/14   DATE OF ADMISSION:  08/23/2005  DATE OF DISCHARGE:  08/23/2005                                HISTORY & PHYSICAL   ADMITTING DIAGNOSIS:  Five days postpartum with shortness of breath and  anemia.   HISTORY OF PRESENT ILLNESS:  This is a 35 year old gravida 1, para 1, white  female, who is status post a vacuum assisted delivery on August 18, 2005,  which was complicated by uterine atony, retained placenta and a vaginal  vault laceration.  The patient's hemoglobin on discharge was approximately  7.  The patient has noted progressively worsening shortness of breath with  exertion and with laying flat over the last 48 hours.  She reports dizziness  and lightheadedness upon standing, but denies any syncope.  She denies any  nausea or vomiting, headache or visual changes.  Vaginal bleeding has been  minimal since discharge.  She denies any lower extremity pain or unilateral  swelling and has no history of blood clotting disorders.   PAST OBSTETRICAL HISTORY:  Gravida 1, para 1.  Delivered on August 18, 2005.  Pregnancy complicated by diet-controlled gestational diabetes and in vitro  fertilization.   GYNECOLOGIC HISTORY:  Infertility and history of HSV.   MEDICAL HISTORY:  Migraines with aura.   PAST SURGICAL HISTORY:  None.   FAMILY HISTORY:  Hypertension.   SOCIAL HISTORY:  She is married and denies tobacco, alcohol or drug use.   PHYSICAL EXAMINATION:  VITAL SIGNS:  She is afebrile.  Blood pressure laying  down is 148/78 and pulse 87, sitting 152/79 and a pulse 87, standing 134/73  and pulse of 96.  Oxygen saturations ranged from 96-90% on room air.  GENERAL:  She is a well-developed, well-nourished white female, who appears  pale, but  in no acute distress.  Displays mild shortness of breath with  speaking.  HEART:  Regular rate and rhythm.  LUNGS:  Clear to auscultation bilaterally, no crackles.  ABDOMEN:  Obese, soft and nontender.  Fundus is firm.  EXTREMITIES:  Trace edema in the ankles bilaterally.  Nontender, no palpable  cords, negative Homans' sign bilaterally and distal pulses 2+.  NEUROLOGIC EXAM:  Nonfocal.  Deep tendon reflexes are 2+ bilaterally with no  clonus.   LABORATORY STUDIES:  Hemoglobin is 7.6, hematocrit 22.4, platelet count 391  and white count 7.5.  Uric acid is 6.5.  Complete metabolic panel is within  normal limits.  Liver function test: AST is 19, ALT is 21 and LDH is 125.   ASSESSMENT:  A 35 year old gravida 1, para 1 white female, 5 days status  post vaginal delivery with postpartum hemorrhage, now with anemia and  shortness of breath.   PLAN:  1.  We will admit for observation.  2.  Check chest x-ray to rule out pulmonary edema.  3.  Anemia.  Given patient's shortness of breath and  symptoms, recommend      blood transfusion.  The risks of transfusion have been reviewed with the      patient in detail.  We discussed the risk of transmission of hepatitis B      of approximately 1 in 250,000, and hepatitis C and HIV risk of      approximately 1 in 2 million.  I also discussed the risk of transfusion      reaction.  The patient voices understanding on all of the above and      agrees to proceed with transfusion.  We will type and crossed for two      units, pretreat with Benadryl and Tylenol, and check CBC 4 hours post      transfusion.      Richardean Sale, M.D.  Electronically Signed     JW/MEDQ  D:  08/23/2005  T:  08/24/2005  Job:  [id number]

## 2010-10-24 NOTE — H&P (Signed)
NAMEALPA, SALVO            ACCOUNT NO.:  [id number]   MEDICAL RECORD NO.:  [id number]          PATIENT TYPE:  INP   LOCATION:  9162                          [facility]ACILITY:  [facility]   PHYSICIAN:  Richardean Sale, M.D.   DATE OF BIRTH:  June 07, 1976   DATE OF ADMISSION:  08/17/2005  DATE OF DISCHARGE:                                HISTORY & PHYSICAL   ADMITTING DIAGNOSES:  1.  Thirty-nine-week intrauterine pregnancy with gestational diabetes, diet      controlled.  2.  Persistent headache.  3.  For induction of labor.   HISTORY OF PRESENT ILLNESS:  This is a 35 year old white female gravida 1,  para 0, who is at [redacted] weeks gestation, pregnancy conceived with in vitro  fertilization, who presented to the office today complaining of a severe  headache.  The patient has a known history of migraine headache with  significant neurologic symptoms.  She denies any visual changes today or any  epigastric pain.  Preeclampsia work up, so far, has been negative.  The  patient reports good fetal movement, denies any loss of fluid or vaginal  bleeding and complains of occasional contractions.  Prenatal care has been  at [facility] with Dr. Richardean Sale as the primary attending.  Pregnancy has been complicated by gestational diabetes, which has been diet  controlled.  The patient also has a history of HSV and has been on Valtrex  for prophylaxis since 35 weeks.   PAST MEDICAL HISTORY:  1.  Migraine headache with neurologic symptoms.  The patient has been      evaluated with a CT scan of the head, which was negative, has used      Vicodin periodically and Midrin during her pregnancy for relief of      headache symptoms.  2.  Obesity.  3.  Infertility.   OBSTETRIC HISTORY:  Gravida 1, para 0.   GYNECOLOGIC HISTORY:  Infertility.  Pregnancy achieved with in vitro  fertilization.  HSV.  No history of abnormal Pap smears.   FAMILY HISTORY:  Chronic hypertension in her mother, a seizure  disorder in  her grandmother, and depression in her mother and maternal grandmother.  No  history of birth defects, congenital anomalies, Down syndrome, cystic  fibrosis, spina bifida or mental retardation.   SOCIAL HISTORY:  She is married and denies tobacco, alcohol or drugs.   REVIEW OF SYSTEMS:  Positive for headache without aura, no visual changes,  positive fetal movement, no loss of fluid, vaginal bleeding or contractions,  no epigastric pain or heart burn, no HSV prodromal symptoms.   PHYSICAL EXAMINATION:  VITAL SIGNS:  Blood pressure 120/80.  Afebrile.  GENERAL:  She is an obese white female, who appears uncomfortable, but in no  acute distress.  HEART:  Regular rate and rhythm.  LUNGS:  Clear.  NEUROLOGIC EXAM:  Nonfocal.  Deep tendon reflexes 2+ bilaterally.  EXTREMITIES:  Trace edema in the extremities, upper and lower bilaterally.  ABDOMEN:  Gravid, soft and nontender.  No right upper quadrant pain.  Estimated fetal weight on Leopold's approximately 8 pounds.  CERVIX:  2-3  cm, 70%, -2 station, vertex anterior.  PERINEUM/VULVA:  no visible HSV lesions   LABORATORY STUDIES:  Blood type A positive, antibody screen negative, RPR  nonreactive, rubella immune.  First trimester platelet count 364 and  hemoglobin 12.  HIV nonreactive.  Hepatitis nonreactive and Pap smear within  normal limits.  Ultra screen negative for any increased risk of Down  syndrome trisomy 13 or 18.  Urine culture negative.  CF screen negative.  A  1-hour glucose tolerance test 144, a 3-hour glucose tolerance test 110, 163,  130 and 131.  Group B beta strep negative.   ASSESSMENT:  A 35 year old gravida 1, para 0 white female at [redacted] weeks  gestation with gestational diabetes and a headache with a history of  migraine with neurologic symptoms, possible atypical preeclampsia.   PLAN:  1.  We will admit to labor and delivery for induction of labor.  2.  We will check preeclampsia labs on admission and  monitor her blood      pressures closely.  If labs abnormal, hypertension develops or headache      is not relieved with IV analgesics, we will start magnesium sulfate for      seizure prophylaxis.  3.  Gestational diabetes.  Check blood sugar on admission and q.2h. in      labor.  If abnormal, start insulin if needed.  4.  Continuous fetal monitoring.  5.  Start low-dose Pitocin per protocol and perform amniotomy.  6.  History of HSV.  No prodromal symptoms and examination negative for any      lesions today.      Richardean Sale, M.D.  Electronically Signed     JW/MEDQ  D:  08/17/2005  T:  08/17/2005  Job:  [id number]

## 2010-10-24 NOTE — Assessment & Plan Note (Signed)
[facility]                           [place] OFFICE NOTE   FRANKLIN, CLAPSADDLE                   MRN:          [id number]  DATE:06/17/2006                            DOB:          1976-02-22    CHIEF COMPLAINT:  A 35 year old white female here to establish new  doctor.   HISTORY OF PRESENT ILLNESS:  Ms. Nicolls was previously seen at  [facility] with Dr. Hyacinth Meeker.  She has not seen that physician for  several years.  She comes to this clinic today because she has been  experiencing some episodes of lightheadedness during the day.  She  states that it is always happening in the middle of the day if she does  not eat between 12 and 2.  She has also been noticing some fatigue  throughout the day.  These symptoms have been going on for several  months.  She denies any increased thirst, increased urination.  She does  sometimes notice that she is hotter than everybody.  She does have a  slight tremor that she attributes to caffeine.  She states that she did  have gestational diabetes during her pregnancy 10 months ago but this  was controlled with diet.   REVIEW OF SYSTEMS:  Occasional migraines, about one every 4-5 months.  Occasional pitting edema but better since birth of her child.  No  dyspnea, no chest pain.  No nausea, vomiting, diarrhea, constipation, or  rectal bleeding.  No myalgias.  Occasional right knee tenderness where  she had right arthroscopy.   PAST MEDICAL HISTORY:  1. Migraines.  2. Baseline tachycardia, around 100 beats per minute.  3. Preeclampsia.  4. Gestational diabetes, diet control.   HOSPITALIZATIONS, SURGERIES, PROCEDURES:  1. In 2004, right arthroscopy.  2. In 2004, carpal tunnel release.  3. NSVD.  4. Pap smear July 2007 normal.   ALLERGIES:  SEPTRA and LEVAQUIN.   MEDICATIONS:  Excedrin Migraine p.r.n. migraine.   SOCIAL HISTORY:  No tobacco use.  Rare alcohol use, about one drink per  month.   No drug use.  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  She is  married.  She has a 37-month-old child.  She is working on starting  exercising again and has purchased a treadmill.  For her nutrition, she  states she tries to get fruits and vegetables but has a weakness with  carbohydrates.  She does get a lot of caffeine each day with many soft  drinks that are sugar filled.  She was unable to tolerate NutraSweet  because it triggers migraines.   FAMILY HISTORY:  Father alive at age 71 with BPH but she does not know  any further history of his family because of lack of contact.  Mother  alive at age 62 with coronary artery disease, hypercholesterolemia,  hypertension, and depression.  Maternal grandfather with MI before age  50.  One brother with hypertension in his 32s.  No family history of  diabetes.  Maternal grandmother with breast cancer diagnosed at age 64.   PHYSICAL EXAMINATION:  VITAL SIGNS:  Height 65 inches, weight  254,  making BMI 41.  Blood pressure 122/88, pulse 100, temperature 98.3.  GENERAL:  Morbidly-obese female in no apparent distress.  HEENT:  PERRLA, extraocular muscles intact.  Oropharynx clear, tympanic  membranes clear, nares clear.  No thyromegaly.  No lymphadenopathy noted  supraclavicular or cervical.  The patient notes some tenderness over a  lymph node left submandibular.  No posterior auricular lymph nodes.  CARDIOVASCULAR:  Regular rate and rhythm.  No murmurs, rubs or gallops.  Normal PMI, 2+ peripheral pulses.  No edema bilateral lower extremities.  PULMONARY:  Clear to auscultation bilaterally.  No wheezes, rales or  rhonchi.  ABDOMEN:  Obese, soft, nontender, normal active bowel sounds.  No  hepatosplenomegaly.  MUSCULOSKELETAL:  Strength 5/5 in the upper and lower extremities.  NEUROLOGIC:  Cranial nerves II-XII grossly intact.  Reflexes 2+  patellar.  Sensation intact peripherally.   ASSESSMENT AND PLAN:  1. Dizziness and fatigue:  This does  sound like hypoglycemia or at      least a drop in blood sugar, whether it is from high to normal or      normal to low during the middle of the day.  I recommended three      healthy meals per day with snacks in-between.  She should never go      more than 5 hours without eating.  I will perform a diabetes screen      to make sure that her blood sugars have been under control.  Given      her tachycardia, her symptoms of being hotter than everybody, and a      mild tremor noted on physical exam, I will check a TSH.  She also      did have some hemorrhage following her recent delivery 10 months      ago and had problems with anemia that resulted in transfusion.      Given her fatigue, I will check a hemoglobin.  2. Prevention:  She is up-to-date with Pap smears.  She is unsure when      tetanus was last performed.  I will check a cholesterol panel for a      baseline, given her family history and her morbid obesity.  She was      encouraged to take a multivitamin and calcium and vitamin D daily.      She was encouraged to begin exercising and work on healthier eating      habits.  I did definitely encourage her to stop drinking soda -      both for the sugar intake and possible rapid rise and drop in blood      sugar, as well as the caffeine.     Kerby Nora, MD  Electronically Signed    AB/MedQ  DD: 06/17/2006  DT: 06/17/2006  Job #: [id number]

## 2011-01-08 ENCOUNTER — Ambulatory Visit: Payer: BC Managed Care – PPO | Admitting: Family Medicine

## 2011-01-09 ENCOUNTER — Ambulatory Visit: Payer: BC Managed Care – PPO | Admitting: Family Medicine

## 2011-01-23 ENCOUNTER — Ambulatory Visit (INDEPENDENT_AMBULATORY_CARE_PROVIDER_SITE_OTHER): Payer: BC Managed Care – PPO | Admitting: Family Medicine

## 2011-01-23 ENCOUNTER — Encounter: Payer: Self-pay | Admitting: Family Medicine

## 2011-01-23 VITALS — BP 110/70 | HR 93 | Temp 98.1°F | Ht 65.0 in | Wt 214.4 lb

## 2011-01-23 DIAGNOSIS — F329 Major depressive disorder, single episode, unspecified: Secondary | ICD-10-CM

## 2011-01-23 DIAGNOSIS — K219 Gastro-esophageal reflux disease without esophagitis: Secondary | ICD-10-CM

## 2011-01-23 MED ORDER — VENLAFAXINE HCL ER 37.5 MG PO CP24: ORAL_CAPSULE | ORAL | Status: AC

## 2011-01-23 MED ORDER — OMEPRAZOLE 40 MG PO CPDR: 40.0000 mg | DELAYED_RELEASE_CAPSULE | Freq: Every day | ORAL | Status: AC

## 2011-01-23 NOTE — Assessment & Plan Note (Signed)
Worsening. Increase prilosec to 40 mg daily. Discussed trigger avoidance. She will call if symptoms are worsening.

## 2011-01-23 NOTE — Progress Notes (Signed)
  Subjective:    Patient ID: [name], female    DOB: Oct 14, 1975, 35 y.o.   MRN: [id number]  HPI   GERD: Prilosec 20 mg daily not working as well as it has in the past. Worsened in last 3-4 months. Continued burning in chest, reflux wakes her up 2 times a week at night.   Depression, worsening in last 6 months: Feeling down, tearful, anhedonia, difficultly falling asleep at night.  Small things upset her.  Occ overwhelming guilt. No SI, no HI. Work and life with daughter effected. Sertraline did not help in the past.   Family history of depression.  Concerned about weight issues, no energy to exercsie. LAp band surgery in past.  Has been seeing rheumatologist.. ? autoiummune disease work up. MRI last week to eval for MS pending.  ANA was positive, other labs normal.   Review of Systems  Constitutional: Negative for fever and fatigue.  HENT: Negative for ear pain.   Eyes: Negative for pain.  Respiratory: Negative for chest tightness and shortness of breath.   Cardiovascular: Negative for chest pain, palpitations and leg swelling.  Gastrointestinal: Negative for abdominal pain and blood in stool.  Genitourinary: Negative for dysuria.  Psychiatric/Behavioral: Positive for behavioral problems, sleep disturbance, dysphoric mood and decreased concentration. Negative for suicidal ideas and self-injury. The patient is not nervous/anxious.        Objective:   Physical Exam  Constitutional: Vital signs are normal. She appears well-developed and well-nourished. She is cooperative.  Non-toxic appearance. She does not appear ill. No distress.  HENT:  Head: Normocephalic.  Right Ear: Hearing, tympanic membrane, external ear and ear canal normal. Tympanic membrane is not erythematous, not retracted and not bulging.  Left Ear: Hearing, tympanic membrane, external ear and ear canal normal. Tympanic membrane is not erythematous, not retracted and not bulging.  Nose: No mucosal  edema or rhinorrhea. Right sinus exhibits no maxillary sinus tenderness and no frontal sinus tenderness. Left sinus exhibits no maxillary sinus tenderness and no frontal sinus tenderness.  Mouth/Throat: Uvula is midline, oropharynx is clear and moist and mucous membranes are normal.  Eyes: Conjunctivae, EOM and lids are normal. Pupils are equal, round, and reactive to light. No foreign bodies found.  Neck: Trachea normal and normal range of motion. Neck supple. Carotid bruit is not present. No mass and no thyromegaly present.  Cardiovascular: Normal rate, regular rhythm, S1 normal, S2 normal, normal heart sounds, intact distal pulses and normal pulses.  Exam reveals no gallop and no friction rub.   No murmur heard. Pulmonary/Chest: Effort normal and breath sounds normal. Not tachypneic. No respiratory distress. She has no decreased breath sounds. She has no wheezes. She has no rhonchi. She has no rales.  Abdominal: Soft. Normal appearance and bowel sounds are normal. She exhibits no distension and no mass. There is no tenderness. There is no rebound and no guarding.       Scar tissue at previous surgical sites Central obesity  Neurological: She is alert.  Skin: Skin is warm, dry and intact. No rash noted.  Psychiatric: Her speech is normal and behavior is normal. Judgment and thought content normal. Her mood appears not anxious. Cognition and memory are normal. She does not exhibit a depressed mood.          Assessment & Plan:

## 2011-01-23 NOTE — Assessment & Plan Note (Addendum)
Start venlafaxine given concerns about weight. Recommended counseling.  Start exercise.  Follow up closely in 4 weeks.

## 2011-01-23 NOTE — Patient Instructions (Signed)
Start venlafaxine at bedtime, titrate up if tolerated. Look into counsling at work, call if we need to set this up for you.

## 2011-02-19 ENCOUNTER — Ambulatory Visit: Payer: BC Managed Care – PPO | Admitting: Family Medicine

## 2011-02-19 DIAGNOSIS — Z0289 Encounter for other administrative examinations: Secondary | ICD-10-CM

## 2011-07-03 ENCOUNTER — Ambulatory Visit: Payer: BC Managed Care – PPO | Admitting: Family Medicine

## 2015-11-06 ENCOUNTER — Ambulatory Visit: Payer: Self-pay | Admitting: Podiatry

## 2015-11-18 ENCOUNTER — Ambulatory Visit: Payer: Self-pay | Admitting: Podiatry

## 2022-07-09 ENCOUNTER — Emergency Department
Admission: EM | Admit: 2022-07-09 | Discharge: 2022-07-09 | Disposition: A | Discharge status: Home / Self Care | Payer: BC Managed Care – PPO | Attending: Emergency Medicine | Admitting: Emergency Medicine

## 2022-07-09 ENCOUNTER — Encounter: Payer: Self-pay | Admitting: Emergency Medicine

## 2022-07-09 ENCOUNTER — Emergency Department: Payer: BC Managed Care – PPO

## 2022-07-09 DIAGNOSIS — Z7982 Long term (current) use of aspirin: Secondary | ICD-10-CM | POA: Insufficient documentation

## 2022-07-09 DIAGNOSIS — R29810 Facial weakness: Secondary | ICD-10-CM

## 2022-07-09 DIAGNOSIS — R519 Headache, unspecified: Secondary | ICD-10-CM | POA: Diagnosis present

## 2022-07-09 LAB — CBC WITH DIFFERENTIAL/PLATELET
Abs Immature Granulocytes: 0.03 10*3/uL (ref 0.00–0.07)
Basophils Absolute: 0 10*3/uL (ref 0.0–0.1)
Basophils Relative: 1 %
Eosinophils Absolute: 0.1 10*3/uL (ref 0.0–0.5)
Eosinophils Relative: 2 %
HCT: 35 % — ABNORMAL LOW (ref 36.0–46.0)
Hemoglobin: 11.1 g/dL — ABNORMAL LOW (ref 12.0–15.0)
Immature Granulocytes: 0 %
Lymphocytes Relative: 24 %
Lymphs Abs: 1.9 10*3/uL (ref 0.7–4.0)
MCH: 28.3 pg (ref 26.0–34.0)
MCHC: 31.7 g/dL (ref 30.0–36.0)
MCV: 89.3 fL (ref 80.0–100.0)
Monocytes Absolute: 0.6 10*3/uL (ref 0.1–1.0)
Monocytes Relative: 7 %
Neutro Abs: 5.1 10*3/uL (ref 1.7–7.7)
Neutrophils Relative %: 66 %
Platelets: 297 10*3/uL (ref 150–400)
RBC: 3.92 MIL/uL (ref 3.87–5.11)
RDW: 13.9 % (ref 11.5–15.5)
WBC: 7.7 10*3/uL (ref 4.0–10.5)
nRBC: 0 % (ref 0.0–0.2)

## 2022-07-09 LAB — BASIC METABOLIC PANEL
Anion gap: 9 (ref 5–15)
BUN: 11 mg/dL (ref 6–20)
CO2: 26 mmol/L (ref 22–32)
Calcium: 8.6 mg/dL — ABNORMAL LOW (ref 8.9–10.3)
Chloride: 104 mmol/L (ref 98–111)
Creatinine, Ser: 0.79 mg/dL (ref 0.44–1.00)
GFR, Estimated: >60 mL/min (ref >60)
Glucose, Bld: 126 mg/dL — ABNORMAL HIGH (ref 70–99)
Potassium: 4.1 mmol/L (ref 3.5–5.1)
Sodium: 139 mmol/L (ref 135–145)

## 2022-07-09 MED ORDER — PREDNISONE 50 MG PO TABS: ORAL_TABLET | ORAL | 0 refills | Status: AC

## 2022-07-09 MED ORDER — IOHEXOL 350 MG/ML SOLN
75.0000 mL | Freq: Once | INTRAVENOUS | Status: AC | PRN
  Administered 2022-07-09: 75 mL via INTRAVENOUS

## 2022-07-09 MED ORDER — VALACYCLOVIR HCL 1 G PO TABS: 1000.0000 mg | ORAL_TABLET | Freq: Three times a day (TID) | ORAL | 0 refills | Status: AC

## 2022-07-09 MED ORDER — SODIUM CHLORIDE 0.9 % IV BOLUS
1000.0000 mL | Freq: Once | INTRAVENOUS | Status: AC
  Administered 2022-07-09: 1000 mL via INTRAVENOUS

## 2022-07-09 MED ORDER — MORPHINE SULFATE (PF) 4 MG/ML IV SOLN
4.0000 mg | Freq: Once | INTRAVENOUS | Status: AC
  Administered 2022-07-09: 4 mg via INTRAVENOUS
  Filled 2022-07-09: qty 1

## 2022-07-09 MED ORDER — ONDANSETRON HCL 4 MG/2ML IJ SOLN
4.0000 mg | Freq: Once | INTRAMUSCULAR | Status: AC
  Administered 2022-07-09: 4 mg via INTRAVENOUS
  Filled 2022-07-09: qty 2

## 2022-07-09 MED ORDER — HYPROMELLOSE (GONIOSCOPIC) 2.5 % OP SOLN: 1.0000 [drp] | Freq: Four times a day (QID) | OPHTHALMIC | 12 refills | Status: AC

## 2022-07-09 NOTE — ED Notes (Signed)
Pt states yesterday she started to have left sided jaw pain. Pt states around 9pm she noted that when she was gargling water, the left side of the face was numb and not working per normal. Pt states she has since been able to drink water without difficulty, sensation is back to normal, but jaw pain is still present on the left side.

## 2022-07-09 NOTE — ED Triage Notes (Signed)
Pt presents via POV with complaints of left side jaw pain with associated metallic taste that started yesterday. Of note, the patient was seen yesterday at [facility] and had viral testing - declines repeats at this time. Denies issues swallowing, sore throat, cough, fevers, chills, CP or SOB.

## 2022-07-09 NOTE — ED Notes (Signed)
Pt taken out in wheelchair due to recent foot surgery and at family request

## 2022-07-09 NOTE — ED Provider Notes (Signed)
I was asked to follow-up with CT scans for this patient which were fortunately negative.  Patient has left-sided facial paralysis albeit very mild which may represent Bell's palsy.  No other focal neurologic deficits to suggest stroke, while outside of stroke window, negative CT angiogram.  Will treat as Bell's palsy with antiviral, prednisone, artificial tears, and advised to tape eyes shut at nighttime for sleep.  Follow-up with PMD and will return with any worsening or new symptoms.   Lucillie Garfinkel, MD 07/09/22 [phone]

## 2022-07-09 NOTE — ED Provider Notes (Addendum)
[facility] Provider Note    Event Date/Time   First MD Initiated Contact with Patient 07/09/22 0501     (approximate)   History   Jaw Pain   HPI  [name] is a 47 y.o. female  who presents to the ED from home with a chief complaint of left facial pain.Patient reports a 1.5-day history of pain to her left jaw/submandibular area. Endorses associated metallic taste to the left side of her tongue. .She does receive chiropractic adjustments to her neck; last adjustment 07/06/2022. Denies headache or neck pain immediately after chiropractic adjustment. Patient was seen at urgent care yesterday for the above symptoms and had negative COVIDTest; declines repeat testing at this time. Presents because tonight she has noted weakness to her left mouth, drooling and  tearing to her left eye.Denies fever, chills, dental pain, cough, chest pain, shortness of breath, abdominal pain, nausea, vomiting or dizziness. Denies slurred speech, confusion, extremity weakness.       Past Medical History   Past Medical History:  Diagnosis Date   Abnormal maternal glucose tolerance, complicating pregnancy, childbirth, or the puerperium, unspecified as to episode of care    Brachial neuritis or radiculitis NOS    Cervicalgia    Depressive disorder, not elsewhere classified    Dizziness and giddiness    Enlargement of lymph nodes    Migraine without aura, without mention of intractable migraine without mention of status migrainosus    Mild or unspecified pre-eclampsia, unspecified as to episode of care    Morbid obesity ([place])    Other abnormal glucose    Pain in joint, shoulder region    Tachycardia, unspecified      Active Problem List   Patient Active Problem List   Diagnosis Date Noted   GERD (gastroesophageal reflux disease) 01/23/2011   Arthralgia 09/09/2010   DEPRESSION, MILD 08/27/2009   SHOULDER PAIN, LEFT 08/27/2009   NECK PAIN, LEFT 08/27/2009   CERVICAL  RADICULOPATHY, LEFT 09/17/2008   PRE-ECLAMPSIA 07/28/2007   GESTATIONAL DIABETES 07/28/2007   TACHYCARDIA ~100 BPM 07/28/2007   CERVICAL LYMPHADENOPATHY, ANTERIOR, LEFT 06/22/2007   OBESITY, MORBID 01/27/2007   COMMON MIGRAINE 01/27/2007   PREDIABETES 01/27/2007     Past Surgical History   Past Surgical History:  Procedure Laterality Date   ACHILLES TENDON REPAIR Right    04/28/2022   CARPAL TUNNEL RELEASE  22004   NSVD     Right arthroscopy  06/08/2002     Home Medications   Prior to Admission medications   Medication Sig Start Date End Date Taking? Authorizing Provider  aspirin-acetaminophen-caffeine (EXCEDRIN MIGRAINE) [phone] MG per tablet Take 1 tablet by mouth every 6 (six) hours as needed.      [provider]  Multiple Vitamin (MULTIVITAMIN) tablet Take 1 tablet by mouth daily.      [provider]  omeprazole (PRILOSEC) 40 MG capsule Take 1 capsule (40 mg total) by mouth daily. 01/23/11   Bedsole, Amy E, MD  venlafaxine (EFFEXOR-XR) 37.5 MG 24 hr capsule Start at one tablet daily for one week, then increase if tolerated to maintanance dose of 75 mg daily. 01/23/11   Jinny Sanders, MD     Allergies  Levofloxacin and Sulfamethoxazole-trimethoprim   Family History   Family History  Problem Relation Age of Onset   Benign prostatic hyperplasia Father    Coronary artery disease Mother    Hyperlipidemia Mother    Hypertension Mother    Depression Mother  Hypertension Brother    Heart attack Maternal Grandfather        <55   Breast cancer Maternal Grandmother 65     Physical Exam  Triage Vital Signs: ED Triage Vitals  Enc Vitals Group     BP 07/09/22 0233 (!) 167/70     Pulse Rate 07/09/22 0233 74     Resp 07/09/22 0233 18     Temp 07/09/22 0233 98.1 F (36.7 C)     Temp Source 07/09/22 0233 Oral     SpO2 07/09/22 0233 98 %     Weight 07/09/22 0231 275 lb (124.7 kg)     Height 07/09/22 0231 5\' 5"  (1.651 m)     Head  Circumference --      Peak Flow --      Pain Score 07/09/22 0231 4     Pain Loc --      Pain Edu? --      Excl. in [place]? --     Updated Vital Signs: BP 121/64   Pulse (!) 58   Temp 98.2 F (36.8 C)   Resp 18   Ht 5\' 5"  (1.651 m)   Wt 124.7 kg   SpO2 96%   BMI 45.76 kg/m    General: Awake, no distress.  CV:  RRR.  Good peripheral perfusion.  Resp:  Normal effort.  CTAB. Abd:  No distention.  Other:  PERRL.  EOMI.  No carotid bruits.  Supple neck without meningismus.  Alert and oriented x 3.  Slightly weak furrowing of left forehead.  Left upper eyelid slightly weaker compared to the left.  No facial droop.  No tongue deviation.  Slight weakness to left mouth on blowing air.  5/5 motor strength and sensation all extremities.  M AE x 4.  Phonation is intact.  No dental abnormality.  Left ear exam unremarkable; no signs of mastoiditis.   ED Results / Procedures / Treatments  Labs (all labs ordered are listed, but only abnormal results are displayed) Labs Reviewed  CBC WITH DIFFERENTIAL/PLATELET - Abnormal; Notable for the following components:      Result Value   Hemoglobin 11.1 (*)    HCT 35.0 (*)    All other components within normal limits  BASIC METABOLIC PANEL - Abnormal; Notable for the following components:   Glucose, Bld 126 (*)    Calcium 8.6 (*)    All other components within normal limits     EKG  None   RADIOLOGY  CTA head and neck: Pending  Official radiology report(s): No results found.   PROCEDURES:  Critical Care performed: No  Procedures   MEDICATIONS ORDERED IN ED: Medications  iohexol (OMNIPAQUE) 350 MG/ML injection 75 mL (has no administration in time range)  sodium chloride 0.9 % bolus 1,000 mL (0 mLs Intravenous Stopped 07/09/22 0641)  ondansetron (ZOFRAN) injection 4 mg (4 mg Intravenous Given 07/09/22 0538)  morphine (PF) 4 MG/ML injection 4 mg (4 mg Intravenous Given 07/09/22 0539)     IMPRESSION / MDM / ASSESSMENT AND PLAN / ED  COURSE  I reviewed the triage vital signs and the nursing notes.                             47 year old female presenting with sharp left facial pain in the jaw/submandibular area; recent chiropractic adjustment of her neck.  Differential diagnosis includes but is not limited to vertebral artery dissection, maxillofacial infection, salivary gland  blockage, CVA, trigeminal neuralgia, Bell's palsy, etc.  I have personally reviewed patient's records and note her recent urgent care visit.  Patient's presentation is most consistent with acute presentation with potential threat to life or bodily function.  Will obtain lab work, CTA head/neck/maxillofacial.  Administer IV analgesia and reassess.  Clinical Course as of 07/09/22 0659  Thu Jul 09, 2022  9937 Pain improved 3/10.  Updated patient and spouse on laboratory results demonstrating normal WBC 7.7, unremarkable electrolytes.  Patient is pending CT scans at this time.  Care transferred to Dr. Jacelyn Grip at change of shift. [JS]    Clinical Course User Index [JS] Paulette Blanch, MD     FINAL CLINICAL IMPRESSION(S) / ED DIAGNOSES   Final diagnoses:  Left facial pain     Rx / DC Orders   ED Discharge Orders     None        Note:  This document was prepared using Dragon voice recognition software and may include unintentional dictation errors.   Paulette Blanch, MD 07/09/22 1696    Paulette Blanch, MD 07/09/22 [phone]

## 2022-07-09 NOTE — Discharge Instructions (Addendum)
Use all medications as prescribed.  Tape your left eye shut when sleeping to prevent drying.  See your doctor this week.  Thank you for choosing [place] for your health care today!  Please see your primary doctor this week for a follow up appointment.   Sometimes, in the early stages of certain disease courses it is difficult to detect in the emergency department evaluation -- so, it is important that you continue to monitor your symptoms and call your doctor right away or return to the emergency department if you develop any new or worsening symptoms.  Please go to the following website to schedule new (and existing) patient appointments:   [email or web address]  If you do not have a primary doctor try calling the following clinics to establish care:  If you have insurance:  [facility] [phone] [place] [address]   [facility]  [phone] [place] [address]   If you do not have insurance:  [facility]  [phone] [address] [place] [address]   The following is another list of primary care offices in the area who are accepting new patients at this time.  Please reach out to one of them directly and let them know you would like to schedule an appointment to follow up on an Emergency Department visit, and/or to establish a new primary care provider (PCP).  There are likely other primary care clinics in the are who are accepting new patients, but this is an excellent place to start:  [place] physician: Dr Lavon Paganini [address] [place], [place] [address] [phone]  [facility] Lead Physician: Dr Steele Sizer [address], [place], [place] [address] [phone]  [place] Physician: Dr Park Liter [address] [place], [place] [address] [phone]  [facility] Lead Physician: Dr Dewaine Oats [place], [place], [place] [address] [phone]  [facility] Physician: Dr Halina Maidens [address] [place], [place] [address] [phone]   It was my pleasure to care for you today.   [place] Brunette Jacelyn Grip, MD

## 2022-07-18 ENCOUNTER — Emergency Department
Admission: EM | Admit: 2022-07-18 | Discharge: 2022-07-19 | Disposition: A | Discharge status: Home / Self Care | Payer: BC Managed Care – PPO | Attending: Emergency Medicine | Admitting: Emergency Medicine

## 2022-07-18 ENCOUNTER — Emergency Department: Payer: BC Managed Care – PPO

## 2022-07-18 ENCOUNTER — Other Ambulatory Visit: Payer: Self-pay

## 2022-07-18 DIAGNOSIS — N390 Urinary tract infection, site not specified: Secondary | ICD-10-CM | POA: Insufficient documentation

## 2022-07-18 DIAGNOSIS — Z20822 Contact with and (suspected) exposure to covid-19: Secondary | ICD-10-CM | POA: Diagnosis not present

## 2022-07-18 DIAGNOSIS — R110 Nausea: Secondary | ICD-10-CM

## 2022-07-18 DIAGNOSIS — G510 Bell's palsy: Secondary | ICD-10-CM | POA: Insufficient documentation

## 2022-07-18 DIAGNOSIS — J018 Other acute sinusitis: Secondary | ICD-10-CM | POA: Diagnosis not present

## 2022-07-18 DIAGNOSIS — R509 Fever, unspecified: Secondary | ICD-10-CM | POA: Diagnosis present

## 2022-07-18 LAB — CBC WITH DIFFERENTIAL/PLATELET
Abs Immature Granulocytes: 0.09 10*3/uL — ABNORMAL HIGH (ref 0.00–0.07)
Basophils Absolute: 0 10*3/uL (ref 0.0–0.1)
Basophils Relative: 0 %
Eosinophils Absolute: 0 10*3/uL (ref 0.0–0.5)
Eosinophils Relative: 0 %
HCT: 38.8 % (ref 36.0–46.0)
Hemoglobin: 12.8 g/dL (ref 12.0–15.0)
Immature Granulocytes: 1 %
Lymphocytes Relative: 7 %
Lymphs Abs: 0.8 10*3/uL (ref 0.7–4.0)
MCH: 28.2 pg (ref 26.0–34.0)
MCHC: 33 g/dL (ref 30.0–36.0)
MCV: 85.5 fL (ref 80.0–100.0)
Monocytes Absolute: 1.2 10*3/uL — ABNORMAL HIGH (ref 0.1–1.0)
Monocytes Relative: 10 %
Neutro Abs: 9.5 10*3/uL — ABNORMAL HIGH (ref 1.7–7.7)
Neutrophils Relative %: 82 %
Platelets: 246 10*3/uL (ref 150–400)
RBC: 4.54 MIL/uL (ref 3.87–5.11)
RDW: 14.2 % (ref 11.5–15.5)
WBC: 11.6 10*3/uL — ABNORMAL HIGH (ref 4.0–10.5)
nRBC: 0 % (ref 0.0–0.2)

## 2022-07-18 LAB — URINALYSIS, ROUTINE W REFLEX MICROSCOPIC
Bilirubin Urine: NEGATIVE
Glucose, UA: NEGATIVE mg/dL
Ketones, ur: NEGATIVE mg/dL
Nitrite: NEGATIVE
Protein, ur: 100 mg/dL — AB
Specific Gravity, Urine: 1.024 (ref 1.005–1.030)
WBC, UA: >50 WBC/hpf (ref 0–5)
pH: 6 (ref 5.0–8.0)

## 2022-07-18 LAB — RESP PANEL BY RT-PCR (RSV, FLU A&B, COVID)  RVPGX2
Influenza A by PCR: NEGATIVE
Influenza B by PCR: NEGATIVE
Resp Syncytial Virus by PCR: NEGATIVE
SARS Coronavirus 2 by RT PCR: NEGATIVE

## 2022-07-18 LAB — BASIC METABOLIC PANEL
Anion gap: 9 (ref 5–15)
BUN: 11 mg/dL (ref 6–20)
CO2: 25 mmol/L (ref 22–32)
Calcium: 8.4 mg/dL — ABNORMAL LOW (ref 8.9–10.3)
Chloride: 101 mmol/L (ref 98–111)
Creatinine, Ser: 0.92 mg/dL (ref 0.44–1.00)
GFR, Estimated: >60 mL/min (ref >60)
Glucose, Bld: 133 mg/dL — ABNORMAL HIGH (ref 70–99)
Potassium: 3.3 mmol/L — ABNORMAL LOW (ref 3.5–5.1)
Sodium: 135 mmol/L (ref 135–145)

## 2022-07-18 LAB — POC URINE PREG, ED: Preg Test, Ur: NEGATIVE

## 2022-07-18 LAB — TROPONIN I (HIGH SENSITIVITY): Troponin I (High Sensitivity): 9 ng/L (ref <18)

## 2022-07-18 MED ORDER — KETOROLAC TROMETHAMINE 30 MG/ML IJ SOLN
30.0000 mg | Freq: Once | INTRAMUSCULAR | Status: AC
  Administered 2022-07-18: 30 mg via INTRAVENOUS
  Filled 2022-07-18: qty 1

## 2022-07-18 MED ORDER — ONDANSETRON HCL 4 MG/2ML IJ SOLN
4.0000 mg | INTRAMUSCULAR | Status: AC
  Administered 2022-07-19: 4 mg via INTRAVENOUS
  Filled 2022-07-18: qty 2

## 2022-07-18 MED ORDER — SODIUM CHLORIDE 0.9 % IV SOLN
2.0000 g | Freq: Once | INTRAVENOUS | Status: AC
  Administered 2022-07-18: 2 g via INTRAVENOUS
  Filled 2022-07-18: qty 20

## 2022-07-18 MED ORDER — IOHEXOL 350 MG/ML SOLN
75.0000 mL | Freq: Once | INTRAVENOUS | Status: AC | PRN
  Administered 2022-07-18: 75 mL via INTRAVENOUS

## 2022-07-18 MED ORDER — SODIUM CHLORIDE 0.9 % IV BOLUS
1000.0000 mL | Freq: Once | INTRAVENOUS | Status: AC
  Administered 2022-07-18: 1000 mL via INTRAVENOUS

## 2022-07-18 MED ORDER — ONDANSETRON HCL 4 MG/2ML IJ SOLN
4.0000 mg | INTRAMUSCULAR | Status: AC
  Administered 2022-07-18: 4 mg via INTRAVENOUS
  Filled 2022-07-18: qty 2

## 2022-07-18 NOTE — ED Provider Notes (Signed)
[facility] Provider Note    Event Date/Time   First MD Initiated Contact with Patient 07/18/22 2203     (approximate)   History   Fever (Patient reports intermittent fevers (highest reported is 102.5), shortness of breath, and light-headed x 3 days; Recently diagnosed with Bell's Palsy)   HPI {Remember to add pertinent medical, surgical, social, and/or OB history to HPI:1} [name] is a 47 y.o. female  ***       Physical Exam   Triage Vital Signs: ED Triage Vitals  Enc Vitals Group     BP 07/18/22 1947 (!) 129/45     Pulse Rate 07/18/22 1947 (!) 106     Resp 07/18/22 1947 (!) 24     Temp 07/18/22 1947 100.2 F (37.9 C)     Temp Source 07/18/22 1947 Oral     SpO2 07/18/22 1947 94 %     Weight 07/18/22 1949 250 lb (113.4 kg)     Height 07/18/22 1949 5' 5"$  (1.651 m)     Head Circumference --      Peak Flow --      Pain Score 07/18/22 1949 0     Pain Loc --      Pain Edu? --      Excl. in [place]? --     Most recent vital signs: Vitals:   07/18/22 2247 07/18/22 2320  BP: (!) 126/53 (!) 111/53  Pulse: (!) 101 (!) 102  Resp: 16 (!) 21  Temp: 100.3 F (37.9 C)   SpO2: 96% 96%    {Only need to document appropriate and relevant physical exam:1} General: Awake, no distress. *** CV:  Good peripheral perfusion. *** Resp:  Normal effort. *** Abd:  No distention. *** Other:  ***   ED Results / Procedures / Treatments   Labs (all labs ordered are listed, but only abnormal results are displayed) Labs Reviewed  CBC WITH DIFFERENTIAL/PLATELET - Abnormal; Notable for the following components:      Result Value   WBC 11.6 (*)    Neutro Abs 9.5 (*)    Monocytes Absolute 1.2 (*)    Abs Immature Granulocytes 0.09 (*)    All other components within normal limits  BASIC METABOLIC PANEL - Abnormal; Notable for the following components:   Potassium 3.3 (*)    Glucose, Bld 133 (*)    Calcium 8.4 (*)    All other components within normal  limits  URINALYSIS, ROUTINE W REFLEX MICROSCOPIC - Abnormal; Notable for the following components:   Color, Urine YELLOW (*)    APPearance HAZY (*)    Hgb urine dipstick MODERATE (*)    Protein, ur 100 (*)    Leukocytes,Ua TRACE (*)    Bacteria, UA MANY (*)    All other components within normal limits  RESP PANEL BY RT-PCR (RSV, FLU A&B, COVID)  RVPGX2  POC URINE PREG, ED  TROPONIN I (HIGH SENSITIVITY)     EKG  ***   RADIOLOGY *** {USE THE WORD "INTERPRETED"!! You MUST document your own interpretation of imaging, as well as the fact that you reviewed the radiologist's report!:1}   PROCEDURES:  Critical Care performed: {CriticalCareYesNo:19197::"Yes, see critical care procedure note(s)","No"}  Procedures   MEDICATIONS ORDERED IN ED: Medications  cefTRIAXone (ROCEPHIN) 2 g in sodium chloride 0.9 % 100 mL IVPB (2 g Intravenous New Bag/Given 07/18/22 2314)  ondansetron (ZOFRAN) injection 4 mg (4 mg Intravenous Given 07/18/22 2239)  sodium chloride 0.9 % bolus  1,000 mL (1,000 mLs Intravenous New Bag/Given 07/18/22 2239)  ketorolac (TORADOL) 30 MG/ML injection 30 mg (30 mg Intravenous Given 07/18/22 2239)  iohexol (OMNIPAQUE) 350 MG/ML injection 75 mL (75 mLs Intravenous Contrast Given 07/18/22 2331)     IMPRESSION / MDM / ASSESSMENT AND PLAN / ED COURSE  I reviewed the triage vital signs and the nursing notes.                              Differential diagnosis includes, but is not limited to, ***  Patient's presentation is most consistent with {EM COPA:27473}  *** {If the patient is on the monitor, remove the brackets and asterisks on the sentence below and remember to document it as a Procedure as well. Otherwise delete the sentence below:1} {**The patient is on the cardiac monitor to evaluate for evidence of arrhythmia and/or significant heart rate changes.**} {Remember to include, when applicable, any/all of the following data: independent review of  imaging independent review of labs (comment specifically on pertinent positives and negatives) review of specific prior hospitalizations, PCP/specialist notes, etc. discuss meds given and prescribed document any discussion with consultants (including hospitalists) any clinical decision tools you used and why (PECARN, NEXUS, etc.) did you consider admitting the patient? document social determinants of health affecting patient's care (homelessness, inability to follow up in a timely fashion, etc) document any pre-existing conditions increasing risk on current visit (e.g. diabetes and HTN increasing danger of high-risk chest pain/ACS) describes what meds you gave (especially parenteral) and why any other interventions?:1}   ----------------------------------------- 12:14 AM on 07/19/2022 ----------------------------------------- Ongoing care assigned to Dr. Leory Plowman.  Follow-up on CT soft tissue of the neck to evaluate and exclude underlying soft tissue or deep tissue infection.  Suspect the patient though has now developed urinary tract infection based on urinalysis, persistent fever with nausea.  No evidence of pyelonephritis by clinical examination.  No costovertebral angle and this by lateral.  She is feeling improved resting comfortably Zofran assisting with nausea and she is now taking apple juice and juices by mouth without difficulty.  Symptoms alleviated with Toradol.  Anticipate discharge to home but a prescription for cephalexin and Zofran provided.  Reassess after imaging if patient well, symptoms well-controlled anticipate discharge.  Continued care for her Bell's palsy as well which she is using lubricant eyedrops and taping her eye.  FINAL CLINICAL IMPRESSION(S) / ED DIAGNOSES   Final diagnoses:  None     Rx / DC Orders   ED Discharge Orders     None        Note:  This document was prepared using Dragon voice recognition software and may include unintentional dictation  errors.

## 2022-07-18 NOTE — ED Triage Notes (Addendum)
Patient reports intermittent fevers (highest reported is 102.5), shortness of breath, and light-headed x 3 days; Recently diagnosed with Bell's Palsy

## 2022-07-19 MED ORDER — ONDANSETRON 4 MG PO TBDP: 4.0000 mg | ORAL_TABLET | Freq: Four times a day (QID) | ORAL | 0 refills | Status: AC | PRN

## 2022-07-19 MED ORDER — CEPHALEXIN 500 MG PO CAPS: 500.0000 mg | ORAL_CAPSULE | Freq: Two times a day (BID) | ORAL | 0 refills | Status: AC

## 2022-07-19 NOTE — ED Provider Notes (Signed)
Emergency department handoff note  Care of this patient was signed out to me at the end of the previous provider shift.  All pertinent patient information was conveyed and all questions were answered.  Patient pending CT angiography of the head and neck that only showed sinusitis without any evidence of acute soft tissue infection or abscess in need of drainage. The patient has been reexamined and is ready to be discharged.  All diagnostic results have been reviewed and discussed with the patient/family.  Care plan has been outlined and the patient/family understands all current diagnoses, results, and treatment plans.  There are no new complaints, changes, or physical findings at this time.  All questions have been addressed and answered.  All medications, if any, that were given while in the emergency department or any that are being prescribed have been reviewed with the patient/family.  All side effects and adverse reactions have been explained.  Patient was instructed to, and agrees to follow-up with their primary care physician as well as return to the emergency department if any new or worsening symptoms develop.   Naaman Plummer, MD 07/19/22 [phone]
